# Patient Record
Sex: Female | Born: 1980 | Hispanic: No | Marital: Married | Smoking: Never smoker
Health system: Southern US, Community
[De-identification: ages and names within clinical notes are randomized; demographics above are authoritative.]

## PROBLEM LIST (undated history)

## (undated) DIAGNOSIS — O24419 Gestational diabetes mellitus in pregnancy, unspecified control: Secondary | ICD-10-CM

## (undated) HISTORY — DX: Gestational diabetes mellitus in pregnancy, unspecified control: O24.419

## (undated) HISTORY — PX: NO PAST SURGERIES: SHX2092

---

## 2014-11-08 ENCOUNTER — Emergency Department (HOSPITAL_COMMUNITY)
Admission: EM | Admit: 2014-11-08 | Discharge: 2014-11-08 | Disposition: A | Discharge status: Home / Self Care | Payer: Worker's Compensation | Attending: Emergency Medicine | Admitting: Emergency Medicine

## 2014-11-08 ENCOUNTER — Encounter (HOSPITAL_COMMUNITY): Payer: Self-pay | Admitting: Emergency Medicine

## 2014-11-08 DIAGNOSIS — S59912A Unspecified injury of left forearm, initial encounter: Secondary | ICD-10-CM | POA: Diagnosis present

## 2014-11-08 DIAGNOSIS — Y9259 Other trade areas as the place of occurrence of the external cause: Secondary | ICD-10-CM | POA: Insufficient documentation

## 2014-11-08 DIAGNOSIS — S51812A Laceration without foreign body of left forearm, initial encounter: Secondary | ICD-10-CM | POA: Diagnosis not present

## 2014-11-08 DIAGNOSIS — W25XXXA Contact with sharp glass, initial encounter: Secondary | ICD-10-CM | POA: Diagnosis not present

## 2014-11-08 DIAGNOSIS — Y990 Civilian activity done for income or pay: Secondary | ICD-10-CM | POA: Insufficient documentation

## 2014-11-08 DIAGNOSIS — Y9389 Activity, other specified: Secondary | ICD-10-CM | POA: Diagnosis not present

## 2014-11-08 MED ORDER — LIDOCAINE-EPINEPHRINE 2 %-1:100000 IJ SOLN
20.0000 mL | Freq: Once | INTRAMUSCULAR | Status: AC
  Administered 2014-11-08: 20 mL via INTRADERMAL
  Filled 2014-11-08: qty 1

## 2014-11-08 NOTE — Discharge Instructions (Signed)
Take the prescribed medication as directed. Keep sutures clean and dry.  Sutures should be removed in 1 week-- we can do this for you here or your primary care physician may do it. Return to the ED for new or worsening symptoms.

## 2014-11-08 NOTE — ED Notes (Addendum)
Per EMS- Pt was washing glassware at work and cut forearm .  6 cm open wound on l/anterior forearm. Pressure dressing applied. Continues to  slow bleed. Pt spoke with husband , then handed her phone to this RN. Husband stated that pt does not have any medical history. "Birth control via injection, denies medication allergies" Stated that he will be in in 30 minutes. PA at bedside, interview to be completed with translator Pt is alert and ambulatory

## 2014-11-08 NOTE — ED Notes (Signed)
Pt restated via translator- that: she received a tetanus shot 1 year ago and  arm was cut while washing a glass at work Pt was [employer] she will received stitches that must be removed in one week. Pt stated that she does not have a doctor

## 2014-11-08 NOTE — ED Notes (Signed)
Per EMS- pt was washing a glass and cut herself accidentally on left arm (approximately 2 inch laceration). Unsure if she has had a tetanus shot. Pt does not speak [place]. Unable to acquire any information from patient. VSS.

## 2014-11-08 NOTE — ED Notes (Signed)
Bed: WTR5 Expected date:  Expected time:  Means of arrival:  Comments: EMS-laceration

## 2014-11-08 NOTE — ED Provider Notes (Signed)
CSN: [id number]     Arrival date & time 11/08/14  1138 History   First MD Initiated Contact with Patient 11/08/14 1148     Chief Complaint  Patient presents with  . Extremity Laceration     (Consider location/radiation/quality/duration/timing/severity/associated sxs/prior Treatment) The history is provided by the patient and medical records.    This is a 34 year old female here with left forearm laceration that occurred while washing dishes. Patient works at a hotel.  Tetanus vaccination is up-to-date, given one less than a year ago.  She denies numbness or paresthesias of left arm. Bleeding minimal on arrival.  History reviewed. No pertinent past medical history. History reviewed. No pertinent past surgical history. No family history on file. Social History  Substance Use Topics  . Smoking status: None  . Smokeless tobacco: None  . Alcohol Use: None   OB History    No data available     Review of Systems  Skin: Positive for wound.  All other systems reviewed and are negative.     Allergies  Review of patient's allergies indicates no known allergies.  Home Medications   Prior to Admission medications   Not on File   BP 105/73 mmHg  Pulse 87  Temp(Src) 97.5 F (36.4 C) (Oral)  Resp 16  SpO2 100%   Physical Exam  Constitutional: She is oriented to person, place, and time. She appears well-developed and well-nourished. No distress.  HENT:  Head: Normocephalic and atraumatic.  Mouth/Throat: Oropharynx is clear and moist.  Eyes: Conjunctivae and EOM are normal. Pupils are equal, round, and reactive to light.  Neck: Normal range of motion. Neck supple.  Cardiovascular: Normal rate, regular rhythm and normal heart sounds.   Pulmonary/Chest: Effort normal and breath sounds normal. No respiratory distress. She has no wheezes.  Musculoskeletal: Normal range of motion.  6cm laceration to left volar forearm; minimal bleeding; no deep tissue, vessel, or tendon  involvement; no retained FB; full ROM of left arm; arm is NVI  Neurological: She is alert and oriented to person, place, and time.  Skin: Skin is warm and dry. She is not diaphoretic.  Psychiatric: She has a normal mood and affect.  Nursing note and vitals reviewed.   ED Course  Procedures (including critical care time)  LACERATION REPAIR Performed by: Garlon Hatchet Authorized by: Garlon Hatchet Consent: Verbal consent obtained. Risks and benefits: risks, benefits and alternatives were discussed Consent given by: patient Patient identity confirmed: provided demographic data Prepped and Draped in normal sterile fashion Wound explored  Laceration Location: left volar forearm  Laceration Length: 6cm  No Foreign Bodies seen or palpated  Anesthesia: local infiltration  Local anesthetic: lidocaine 2% with epinephrine  Anesthetic total: 10 ml  Irrigation method: syringe Amount of cleaning: standard  Skin closure: 4-0 prolene  Number of sutures: 10  Technique: simple interrupted  Patient tolerance: Patient tolerated the procedure well with no immediate complications.  Labs Review Labs Reviewed - No data to display  Imaging Review No results found. I have personally reviewed and evaluated these images and lab results as part of my medical decision-making.   EKG Interpretation None      MDM   Final diagnoses:  Forearm laceration, left, initial encounter   34 year old female with left forearm laceration that occurred prior to arrival while washing dishes.  6 cm laceration noted to left volar forearm. Tetanus up-to-date.  Laceration repaired as above, tolerated well.  Dressing applied.  Wound care and follow-up instructions were  discussed with patient and husband via language interpreter.  FU for suture removal in 1 week.  UDS for worker's compo was performed by phlebotomy prior to discharge.  Discussed plan with patient, he/she acknowledged understanding and agreed  with plan of care.  Return precautions given for new or worsening symptoms.  Garlon Hatchet, PA-C 11/08/14 1415  Garlon Hatchet, PA-C 11/08/14 1416  Lavera Guise, MD 11/08/14 [phone]

## 2014-11-15 ENCOUNTER — Encounter (HOSPITAL_COMMUNITY): Payer: Self-pay | Admitting: Emergency Medicine

## 2014-11-15 ENCOUNTER — Emergency Department (HOSPITAL_COMMUNITY)
Admission: EM | Admit: 2014-11-15 | Discharge: 2014-11-15 | Disposition: A | Discharge status: Home / Self Care | Payer: Worker's Compensation | Attending: Emergency Medicine | Admitting: Emergency Medicine

## 2014-11-15 DIAGNOSIS — Z4802 Encounter for removal of sutures: Secondary | ICD-10-CM | POA: Insufficient documentation

## 2014-11-15 DIAGNOSIS — Z4801 Encounter for change or removal of surgical wound dressing: Secondary | ICD-10-CM | POA: Diagnosis not present

## 2014-11-15 DIAGNOSIS — Z5189 Encounter for other specified aftercare: Secondary | ICD-10-CM

## 2014-11-15 MED ORDER — BACITRACIN ZINC 500 UNIT/GM EX OINT
TOPICAL_OINTMENT | CUTANEOUS | Status: AC
  Filled 2014-11-15: qty 0.9

## 2014-11-15 NOTE — Discharge Instructions (Signed)
Incision Care ° An incision is a surgical cut to open your skin. You need to take care of your incision. This helps you to not get an infection. °HOME CARE °· Only take medicine as told by your doctor. °· Do not take off your bandage (dressing) or get your incision wet until your doctor approves. Change the bandage and call your doctor if the bandage gets wet, dirty, or starts to smell. °· Take showers. Do not take baths, swim, or do anything that may soak your incision until it heals. °· Return to your normal diet and activities as told or allowed by your doctor. °· Avoid lifting any weight until your doctor approves. °· Put medicine that helps lessen itching on your incision as told by your doctor. Do not pick or scratch at your incision. °· Keep your doctor visit to have your stitches (sutures) or staples removed. °· Drink enough fluids to keep your pee (urine) clear or pale yellow. °GET HELP RIGHT AWAY IF: °· You have a fever. °· You have a rash. °· You are dizzy, or you pass out (faint) while standing. °· You have trouble breathing. °· You have a reaction or side effects to medicine given to you. °· You have redness, puffiness (swelling), or more pain in the incision and medicine does not help. °· You have fluid, blood, or yellowish-white fluid (pus) coming from the incision lasting over 1 day. °· You have muscle aches, chills, or you feel sick. °· You have a bad smell coming from the incision or bandage. °· Your incision opens up after stitches, staples, or sticky strips have been removed. °· You keep feeling sick to your stomach (nauseous) or keep throwing up (vomiting). °MAKE SURE YOU:  °· Understand these instructions. °· Will watch your condition. °· Will get help right away if you are not doing well or get worse. °Document Released: 05/27/2011 Document Reviewed: 04/28/2013 °ExitCare® Patient Information ©2015 [employer]. This information is not intended to replace advice given to you by your health  care provider. Make sure you discuss any questions you have with your health care provider. ° ° ° ° °

## 2014-11-15 NOTE — ED Provider Notes (Signed)
CSN: [id number]     Arrival date & time 11/15/14  1734 History   This chart was scribed for non-physician practitioner, Vickie Berry, PA-C, working with Vickie Loveless, MD by Vickie Prince, ED Scribe. This patient was seen in room WTR8/WTR8 and the patient's care was started at 6:07 PM.   Chief Complaint  Patient presents with  . Suture / Staple Removal    L FA   The history is provided by the patient and the spouse. No language interpreter was used.    HPI Comments:  Vickie Prince is a 34 y.o. female who presents to the Emergency Department for suture removal. Pt had 10 sutures placed in her left forearm on 11/08/14 for a laceration to the site which occurred while washing dishes.  She denies drainage from the site and pain. Pt has no new complaints or symptoms at this time. Pt's first language is not [place]; history translated/provided by spouse.   History reviewed. No pertinent past medical history. History reviewed. No pertinent past surgical history. No family history on file. Social History  Substance Use Topics  . Smoking status: Unknown If Ever Smoked  . Smokeless tobacco: None  . Alcohol Use: None   OB History    No data available     Review of Systems  Constitutional: Negative for fever and chills.  Skin: Positive for wound (Suture removal).   Allergies  Review of patient's allergies indicates no known allergies.  Home Medications   Prior to Admission medications   Not on File   BP 112/71 mmHg  Pulse 70  Resp 16  SpO2 100% Physical Exam  Constitutional: She is oriented to person, place, and time. She appears well-developed and well-nourished. No distress.  HENT:  Head: Normocephalic and atraumatic.  Right Ear: External ear normal.  Left Ear: External ear normal.  Nose: Nose normal.  Mouth/Throat: Oropharynx is clear and moist. No oropharyngeal exudate.  Eyes: Conjunctivae and EOM are normal. Pupils are equal, round, and reactive to light. Right eye exhibits  no discharge. Left eye exhibits no discharge. No scleral icterus.  Neck: Normal range of motion. Neck supple. No JVD present. No tracheal deviation present.  Cardiovascular: Normal rate and regular rhythm.   Pulmonary/Chest: Effort normal and breath sounds normal. No stridor. No respiratory distress.  Musculoskeletal: Normal range of motion. She exhibits no edema.  Lymphadenopathy:    She has no cervical adenopathy.  Neurological: She is alert and oriented to person, place, and time. She exhibits normal muscle tone. Coordination normal.  Skin: Skin is warm and dry. She is not diaphoretic.  10 sutures noted to left forearm--clean, dry, intact  Mild bruising noted to site    Psychiatric: She has a normal mood and affect. Her behavior is normal. Judgment and thought content normal.   ED Course  Procedures   DIAGNOSTIC STUDIES:  Oxygen Saturation is 100% on RA, normal by my interpretation.    COORDINATION OF CARE:  6:11 PM Discussed treatment plan with pt at bedside and pt agreed to plan. Sutures removed at bedside. Pt tolerated procedure well    Labs Review Labs Reviewed - No data to display  Imaging Review No results found. I have personally reviewed and evaluated these images and lab results as part of my medical decision-making.   EKG Interpretation None      MDM   Final diagnoses:  None    The laceration was healing nicely, was clean, dry, and intact. Sutures removed, pt tolerated, no bleeding.  Pt given wound care instructions, was asked to stay while we apply bandage, however pt and spouse had left the room prior to discharge by the nurse and therefore did not receive AVS.    I personally performed the services described in this documentation, which was scribed in my presence. The recorded information has been reviewed and is accurate.    Vickie Berry, PA-C 11/16/14 Vickie Prince  Vickie Loveless, MD 11/19/14 [phone]

## 2014-11-15 NOTE — ED Notes (Signed)
Pt awake, alert, appropriate, here for suture removal from LFA.  Denies complains.  Sutures c/d/i.  Skin PWD.  MAEI.  NAD.

## 2014-11-15 NOTE — ED Notes (Signed)
Dressing applied by EDPA, pt left prior to d/c instructions.

## 2016-02-12 LAB — OB RESULTS CONSOLE GC/CHLAMYDIA
CHLAMYDIA, DNA PROBE: NEGATIVE
GC PROBE AMP, GENITAL: NEGATIVE

## 2016-02-12 LAB — OB RESULTS CONSOLE VARICELLA ZOSTER ANTIBODY, IGG: Varicella: IMMUNE

## 2016-02-12 LAB — OB RESULTS CONSOLE ABO/RH: RH Type: NEGATIVE

## 2016-02-12 LAB — OB RESULTS CONSOLE HIV ANTIBODY (ROUTINE TESTING): HIV: NONREACTIVE

## 2016-02-12 LAB — OB RESULTS CONSOLE RPR: RPR: NONREACTIVE

## 2016-02-12 LAB — OB RESULTS CONSOLE ANTIBODY SCREEN: Antibody Screen: NEGATIVE

## 2016-02-12 LAB — SICKLE CELL SCREEN: SICKLE CELL SCREEN: NEGATIVE

## 2016-02-12 LAB — OB RESULTS CONSOLE RUBELLA ANTIBODY, IGM: Rubella: IMMUNE

## 2016-02-12 LAB — OB RESULTS CONSOLE HEPATITIS B SURFACE ANTIGEN: Hepatitis B Surface Ag: NEGATIVE

## 2016-03-18 DIAGNOSIS — O24419 Gestational diabetes mellitus in pregnancy, unspecified control: Secondary | ICD-10-CM

## 2016-03-18 HISTORY — DX: Gestational diabetes mellitus in pregnancy, unspecified control: O24.419

## 2016-03-18 NOTE — L&D Delivery Note (Addendum)
Delivery Note Called for emergent delivery.  Patient was 6cm about 20 min ago and got a dose of Fentanyl.  Now complete and crowning.    At 9:09 PM a viable and healthy female was delivered via Vaginal, Spontaneous Delivery (Presentation: OA  ).  APGAR: 8, 9; weight  .  FHR slow (80s) at first, sent to warmer, team arrived and baby improved rapidly. Placenta status: spont and grossly intact with 3V Cord:  with the following complications: nuchal x 1  Cord pH: Pending  Anesthesia:  Local with repair Episiotomy: None Lacerations: Periurethral Suture Repair: 4-0 Monocryl Est. Blood Loss (mL): 200  Mom to postpartum.  Baby to Couplet care / Skin to Skin.  Vickie BourgeoisMarie Vasilisa Prince 08/30/2016, 9:31 PM

## 2016-03-22 ENCOUNTER — Other Ambulatory Visit (HOSPITAL_COMMUNITY): Payer: Self-pay | Admitting: Obstetrics and Gynecology

## 2016-03-22 DIAGNOSIS — O280 Abnormal hematological finding on antenatal screening of mother: Secondary | ICD-10-CM

## 2016-03-22 DIAGNOSIS — Z3A18 18 weeks gestation of pregnancy: Secondary | ICD-10-CM

## 2016-03-25 ENCOUNTER — Encounter (HOSPITAL_COMMUNITY): Payer: Self-pay | Admitting: Obstetrics and Gynecology

## 2016-04-03 ENCOUNTER — Encounter (HOSPITAL_COMMUNITY): Payer: Self-pay | Admitting: *Deleted

## 2016-04-04 ENCOUNTER — Ambulatory Visit (HOSPITAL_COMMUNITY)
Admission: RE | Admit: 2016-04-04 | Discharge: 2016-04-04 | Disposition: A | Discharge status: Home / Self Care | Payer: Medicaid Other | Source: Ambulatory Visit | Attending: Obstetrics and Gynecology | Admitting: Obstetrics and Gynecology

## 2016-04-04 ENCOUNTER — Encounter (HOSPITAL_COMMUNITY): Payer: Self-pay

## 2016-04-04 DIAGNOSIS — O280 Abnormal hematological finding on antenatal screening of mother: Secondary | ICD-10-CM

## 2016-04-04 DIAGNOSIS — Z363 Encounter for antenatal screening for malformations: Secondary | ICD-10-CM | POA: Insufficient documentation

## 2016-04-04 DIAGNOSIS — Z3A18 18 weeks gestation of pregnancy: Secondary | ICD-10-CM | POA: Insufficient documentation

## 2016-04-04 DIAGNOSIS — O289 Unspecified abnormal findings on antenatal screening of mother: Secondary | ICD-10-CM | POA: Diagnosis not present

## 2016-04-08 DIAGNOSIS — O280 Abnormal hematological finding on antenatal screening of mother: Secondary | ICD-10-CM | POA: Insufficient documentation

## 2016-04-08 NOTE — Progress Notes (Signed)
Genetic Counseling  High-Risk Gestation Note  Appointment Date:  04/04/16 Referred By: Ferman HammingMcNamara, Michael T, MD Date of Birth:  04/22/1980   Pregnancy History: G2P1001 Estimated Date of Delivery: 09/03/16 Estimated Gestational Age: 6335w2d Attending: Particia NearingMartha Decker, MD   [name] and her partner, [name], were seen for genetic counseling because of an increased risk for fetal Down syndrome based on Quad screening [facility]. Vickie Prince/English medical interpreter [phone] from [facility] [employer] provided telephonic interpretation for today's visit.   In summary:  Reviewed results of Quad screening test  Increased risk for fetal Down syndrome (1 in 5682)  Discussed additional screening options  NIPS - declined  Ultrasound - performed today; within normal limits  Discussed diagnostic testing options  Amniocentesis - declined  Reviewed family history concerns   They were counseled regarding the screening result and the associated 1 in 82 risk for fetal Down syndrome.  We reviewed chromosomes, nondisjunction, and the common features and variable prognosis of Down syndrome.  In addition, we reviewed the screen adjusted reduction in risks for trisomy 18 and open neural tube defects.  We also discussed other explanations for a screen positive result including: a gestational dating error, differences in maternal metabolism, and normal variation.  We reviewed other available screening options including noninvasive prenatal screening (NIPS)/cell free DNA (cfDNA) screening and detailed ultrasound. They were counseled that screening tests are used to modify a patient's a priori risk for aneuploidy, typically based on age. This estimate provides a pregnancy specific risk assessment. We reviewed the benefits and limitations of each option. Specifically, we discussed the conditions for which each test screens, the detection rates, and false positive  rates of each. They were also counseled regarding diagnostic testing via amniocentesis. We reviewed the approximate 1 in 300-500 risk for complications from amniocentesis, including spontaneous pregnancy loss. We discussed the possible results that the tests might provide including: positive, negative, unanticipated, and no result. Finally, they were counseled regarding the cost of each option and potential out of pocket expenses.   After consideration of all the options, they declined NIPS and amniocentesis, stating that they would rather await postnatal evaluation for Down syndrome, if it is warranted at that time. The couple indicated that they would not want to know prenatally regarding fetal Down syndrome.    A complete ultrasound was performed today. The ultrasound report will be sent under separate cover. There were no visualized fetal anomalies or markers suggestive of aneuploidy. They understand that screening tests cannot rule out all birth defects or genetic syndromes. The patient was advised of this limitation and states she still does not want additional testing at this time.   Both family histories were reviewed and found to be noncontributory for birth defects, intellectual disability, recurrent pregnancy loss, or known genetic conditions. Without further information regarding the provided family history, an accurate genetic risk cannot be calculated. Further genetic counseling is warranted if more information is obtained.  [name] denied exposure to environmental toxins or chemical agents. She denied the use of alcohol, tobacco or street drugs.   I counseled this couple for approximately 45 minutes regarding the above risks and available options.   Vickie PlowmanKaren Shaheim Mahar, MS,  Certified Genetic Counselor 04/08/2016

## 2016-04-23 ENCOUNTER — Other Ambulatory Visit (HOSPITAL_COMMUNITY): Payer: Self-pay

## 2016-07-25 ENCOUNTER — Encounter: Payer: Self-pay | Admitting: Family Medicine

## 2016-07-25 ENCOUNTER — Ambulatory Visit (INDEPENDENT_AMBULATORY_CARE_PROVIDER_SITE_OTHER): Payer: Medicaid Other | Admitting: Family Medicine

## 2016-07-25 VITALS — BP 105/64 | HR 78 | Wt 145.0 lb

## 2016-07-25 DIAGNOSIS — O09899 Supervision of other high risk pregnancies, unspecified trimester: Secondary | ICD-10-CM

## 2016-07-25 DIAGNOSIS — O24419 Gestational diabetes mellitus in pregnancy, unspecified control: Secondary | ICD-10-CM

## 2016-07-25 DIAGNOSIS — Z789 Other specified health status: Secondary | ICD-10-CM | POA: Diagnosis not present

## 2016-07-25 DIAGNOSIS — O09893 Supervision of other high risk pregnancies, third trimester: Secondary | ICD-10-CM

## 2016-07-25 DIAGNOSIS — O280 Abnormal hematological finding on antenatal screening of mother: Secondary | ICD-10-CM | POA: Diagnosis not present

## 2016-07-25 MED ORDER — ACCU-CHEK NANO SMARTVIEW W/DEVICE KIT: 1.0000 | PACK | 0 refills | Status: DC

## 2016-07-25 MED ORDER — GLUCOSE BLOOD VI STRP: ORAL_STRIP | 12 refills | Status: DC

## 2016-07-25 MED ORDER — ACCU-CHEK FASTCLIX LANCETS MISC: 1.0000 [IU] | Freq: Four times a day (QID) | 12 refills | Status: DC

## 2016-07-25 NOTE — Progress Notes (Signed)
INtrepreter [phone]

## 2016-07-25 NOTE — Progress Notes (Signed)
[phone]  [place]/ English interpreter

## 2016-07-25 NOTE — Progress Notes (Signed)
  Subjective:    Vickie Prince is a G2P1001 6766w2d being seen today for her first obstetrical visit.  Her obstetrical history is significant for GDM and increased DSR. [facility]. Refered to our office due to failing 3hr GTT. Has elevated DSR - declined NIPS, amnio.. Patient does intend to breast feed. Pregnancy history fully reviewed.  Patient reports no complaints.  Vitals:   07/25/16 0812  BP: 105/64  Pulse: 78  Weight: 145 lb (65.8 kg)    HISTORY: OB History  Gravida Para Term Preterm AB Living  2 1 1     1   SAB TAB Ectopic Multiple Live Births          1    # Outcome Date GA Lbr Len/2nd Weight Sex Delivery Anes PTL Lv  2 Current           1 Term    6 lb 9.8 oz (3 kg) M Vag-Spont   LIV     Past Medical History:  Diagnosis Date  . Medical history non-contributory    Past Surgical History:  Procedure Laterality Date  . NO PAST SURGERIES     No family history on file.   Exam    Uterus:     Pelvic Exam:   System:     Skin: normal coloration and turgor, no rashes    Neurologic: gait normal; reflexes normal and symmetric, no focal deficits   Extremities: normal strength, tone, and muscle mass   HEENT PERRLA and extra ocular movement intact   Mouth/Teeth mucous membranes moist, pharynx normal without lesions   Neck supple and no masses   Cardiovascular: regular rate and rhythm, no murmurs or gallops   Respiratory:  appears well, vitals normal, no respiratory distress, acyanotic, normal RR, ear and throat exam is normal, neck free of mass or lymphadenopathy, chest clear, no wheezing, crepitations, rhonchi, normal symmetric air entry   Abdomen: soft, non-tender; bowel sounds normal; no masses,  no organomegaly   Fundus:  32 weeks      Assessment:    Pregnancy: G2P1001 Patient Active Problem List   Diagnosis Date Noted  . Supervision of other high risk pregnancy, antepartum 07/25/2016  . Non-English speaking patient 07/25/2016   . Gestational diabetes mellitus (GDM) in third trimester 07/25/2016  . Abnormal maternal serum screening test 04/08/2016        Plan:     1. Supervision of other high risk pregnancy, antepartum FHT normal. Reviewed PNL - appears normal, other than Quad. - Referral to Nutrition and Diabetes Services  2. Non-English speaking patient Interpreter Used. - Referral to Nutrition and Diabetes Services  3. Gestational diabetes mellitus (GDM) in third trimester, gestational diabetes method of control unspecified Discussed testing CBGs. Will refer to Diabetes and have pt return. - Referral to Nutrition and Diabetes Services  4. Abnormal maternal serum screening test Declines NIPS.   Levie HeritageJacob J Hanley Woerner 07/25/2016

## 2016-08-06 ENCOUNTER — Encounter: Payer: Self-pay | Admitting: Dietician

## 2016-08-06 ENCOUNTER — Encounter: Payer: Medicaid Other | Attending: Family Medicine | Admitting: Dietician

## 2016-08-06 DIAGNOSIS — Z3A35 35 weeks gestation of pregnancy: Secondary | ICD-10-CM | POA: Insufficient documentation

## 2016-08-06 DIAGNOSIS — O09893 Supervision of other high risk pregnancies, third trimester: Secondary | ICD-10-CM | POA: Insufficient documentation

## 2016-08-06 DIAGNOSIS — O24419 Gestational diabetes mellitus in pregnancy, unspecified control: Secondary | ICD-10-CM | POA: Diagnosis not present

## 2016-08-06 DIAGNOSIS — Z789 Other specified health status: Secondary | ICD-10-CM | POA: Insufficient documentation

## 2016-08-06 DIAGNOSIS — Z713 Dietary counseling and surveillance: Secondary | ICD-10-CM | POA: Insufficient documentation

## 2016-08-06 NOTE — Progress Notes (Signed)
Diabetes Self-Management Education  Visit Type: First/Initial  Appt. Start Time: 0835 Appt. End Time: 1000  08/06/2016  [name], identified by name and date of birth, is a 36 y.o. female with a diagnosis of Diabetes: Gestational Diabetes.   She is [redacted] weeks gestation.  Her weight today was 147 increased from 145 lbs 2 weeks ago.  She does not know her prepregnancy weight.  Patient lives with her husband, 1 yo step child and 1 yo child.  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  She has been in the [place] 3 years from [place] and speaks [place].  She is here today with her interpretor Kibrom Gebremeriam from [employer].  She follows a low fat diet with meat only once per week and mostly vegetables.  ASSESSMENT  Weight 147 lb (66.7 kg), last menstrual period 11/28/2015. There is no height or weight on file to calculate BMI.      Diabetes Self-Management Education - 08/06/16 0840      Visit Information   Visit Type First/Initial     Initial Visit   Diabetes Type Gestational Diabetes   Are you currently following a meal plan? No   Are you taking your medications as prescribed? Not on Medications   Date Diagnosed 5/18     Health Coping   How would you rate your overall health? Good     Psychosocial Assessment   Patient Belief/Attitude about Diabetes Motivated to manage diabetes   Self-care barriers English as a second language   Self-management support Doctor's office;Family  Interpretor   Other persons present Patient;Other (comment)  Interpretor   Patient Concerns Nutrition/Meal planning;Other (comment)  GDM   Special Needs Other (comment);Verbal instruction  with interpretor   Preferred Learning Style Auditory   Learning Readiness Ready   How often do you need to have someone help you when you read instructions, pamphlets, or other written materials from your doctor or pharmacy? 5 - Always   What is the last grade level you completed in school? 5th  grade in [place]     Pre-Education Assessment   Patient understands the diabetes disease and treatment process. Needs Instruction   Patient understands incorporating nutritional management into lifestyle. Needs Instruction   Patient undertands incorporating physical activity into lifestyle. Needs Instruction   Patient understands using medications safely. Needs Instruction   Patient understands monitoring blood glucose, interpreting and using results Needs Instruction   Patient understands prevention, detection, and treatment of acute complications. Needs Instruction   Patient understands prevention, detection, and treatment of chronic complications. Needs Instruction   Patient understands how to develop strategies to address psychosocial issues. Needs Instruction   Patient understands how to develop strategies to promote health/change behavior. Needs Instruction     Complications   How often do you check your blood sugar? 0 times/day (not testing)     Dietary Intake   Breakfast Cereal with milk OR WW bread and juice (16 ounces)  5:30   Snack (morning) fruit   Lunch bread, vegetables, beans  12:00   Snack (afternoon) coffee with 1 %milk, 1 tsp sugar with bread   Dinner simular to lunch, meat occasionally (once per week)  7   Snack (evening) none   Beverage(s) coffee with 1% milk, 1 tsp sugar; water     Exercise   Exercise Type Light (walking / raking leaves)  standing a lot at work and cooking and other chairs at work     Patient Education  Previous Diabetes Education No   Disease state  Other (comment)   Nutrition management  Food label reading, portion sizes and measuring food.;Role of diet in the treatment of diabetes and the relationship between the three main macronutrients and blood glucose level;Meal options for control of blood glucose level and chronic complications.   Physical activity and exercise  Role of exercise on diabetes management, blood pressure control and  cardiac health.;Helped patient identify appropriate exercises in relation to his/her diabetes, diabetes complications and other health issue.   Monitoring Purpose and frequency of SMBG.;Identified appropriate SMBG and/or A1C goals.;Taught/evaluated SMBG meter.   Acute complications Taught treatment of hypoglycemia - the 15 rule.   Preconception care Pregnancy and GDM  Role of pre-pregnancy blood glucose control on the development of the fetus;Reviewed with patient blood glucose goals with pregnancy   Personal strategies to promote health Helped patient develop diabetes management plan for (enter comment)     Individualized Goals (developed by patient)   Nutrition General guidelines for healthy choices and portions discussed   Physical Activity Exercise 3-5 times per week   Medications Not Applicable   Monitoring  test my blood glucose as discussed   Problem Solving timing for blood sugars and how to do this at work, time for snacks, protein with snacks   Health Coping discuss diabetes with (comment)  MD/RD     Post-Education Assessment   Patient understands the diabetes disease and treatment process. Demonstrates understanding / competency   Patient understands incorporating nutritional management into lifestyle. Demonstrates understanding / competency   Patient undertands incorporating physical activity into lifestyle. Demonstrates understanding / competency   Patient understands using medications safely. Demonstrates understanding / competency   Patient understands monitoring blood glucose, interpreting and using results Demonstrates understanding / competency   Patient understands prevention, detection, and treatment of acute complications. Demonstrates understanding / competency   Patient understands prevention, detection, and treatment of chronic complications. Demonstrates understanding / competency   Patient understands how to develop strategies to address psychosocial issues.  Demonstrates understanding / competency   Patient understands how to develop strategies to promote health/change behavior. Demonstrates understanding / competency     Outcomes   Expected Outcomes Demonstrated interest in learning. Expect positive outcomes   Future DMSE PRN   Program Status Completed      Individualized Plan for Diabetes Self-Management Training:   Learning Objective:  Patient will have a greater understanding of diabetes self-management. Patient education plan is to attend individual and/or group sessions per assessed needs and concerns.   Plan:   Avoid added sugars. Avoid fruit in the morning. Add a small amount of protein with each meal and snack. (beans, yogurt, egg, small amount of meat).  Expected Outcomes:  Demonstrated interest in learning. Expect positive outcomes  Education material provided: Nutrition During Pregnancy for gestational diabetes.  We have no information in her language but highlighted the blood sugar goals on the paper.  If problems or questions, patient to contact team via:  Phone  Future DSME appointment: PRN

## 2016-08-08 ENCOUNTER — Other Ambulatory Visit (HOSPITAL_COMMUNITY)
Admission: RE | Admit: 2016-08-08 | Discharge: 2016-08-08 | Disposition: A | Discharge status: Home / Self Care | Payer: Medicaid Other | Source: Ambulatory Visit | Attending: Family Medicine | Admitting: Family Medicine

## 2016-08-08 ENCOUNTER — Ambulatory Visit (INDEPENDENT_AMBULATORY_CARE_PROVIDER_SITE_OTHER): Payer: Medicaid Other | Admitting: Family Medicine

## 2016-08-08 VITALS — BP 105/68 | HR 97 | Wt 149.0 lb

## 2016-08-08 DIAGNOSIS — Z3A36 36 weeks gestation of pregnancy: Secondary | ICD-10-CM

## 2016-08-08 DIAGNOSIS — Z789 Other specified health status: Secondary | ICD-10-CM

## 2016-08-08 DIAGNOSIS — O09893 Supervision of other high risk pregnancies, third trimester: Secondary | ICD-10-CM | POA: Insufficient documentation

## 2016-08-08 DIAGNOSIS — O24419 Gestational diabetes mellitus in pregnancy, unspecified control: Secondary | ICD-10-CM | POA: Diagnosis not present

## 2016-08-08 DIAGNOSIS — O09899 Supervision of other high risk pregnancies, unspecified trimester: Secondary | ICD-10-CM

## 2016-08-08 NOTE — Progress Notes (Signed)
Subjective:  Vickie Prince is a 36 y.o. G2P1001 at 3743w2d being seen today for ongoing prenatal care.  She is currently monitored for the following issues for this high-risk pregnancy and has Abnormal maternal serum screening test; Supervision of other high risk pregnancy, antepartum; Non-English speaking patient; and Gestational diabetes mellitus (GDM) in third trimester on her problem list.  GDM: Patient diet controlled.  Reports no hypoglycemic episodes.  Tolerating medication well Started testing 2 days ago. Fasting: controlled 2hr PP: 2 elevated values, otherwise controlled.  Patient reports no complaints.  Contractions: Not present. Vag. Bleeding: None.  Movement: Present. Denies leaking of fluid.   The following portions of the patient's history were reviewed and updated as appropriate: allergies, current medications, past family history, past medical history, past social history, past surgical history and problem list. Problem list updated.  Objective:   Vitals:   08/08/16 1116  BP: 105/68  Pulse: 97  Weight: 149 lb (67.6 kg)    Fetal Status:     Movement: Present     General:  Alert, oriented and cooperative. Patient is in no acute distress.  Skin: Skin is warm and dry. No rash noted.   Cardiovascular: Normal heart rate noted  Respiratory: Normal respiratory effort, no problems with respiration noted  Abdomen: Soft, gravid, appropriate for gestational age. Pain/Pressure: Present     Pelvic: Vag. Bleeding: None Vag D/C Character: Thin   Cervical exam deferred        Extremities: Normal range of motion.  Edema: None  Mental Status: Normal mood and affect. Normal behavior. Normal judgment and thought content.   Urinalysis:      Assessment and Plan:  Pregnancy: G2P1001 at 7743w2d  1. Supervision of other high risk pregnancy, antepartum FHT and FH normal.  2. Gestational diabetes mellitus (GDM) in third trimester, gestational diabetes method of control unspecified Continue  checking CBGs fasting and 2hr PP. Delivery at 40 weeks  3. [redacted] weeks gestation of pregnancy - GC/Chlamydia probe amp ([place] - Culture, beta strep (group b only)  4. Non-English speaking patient Interpreter used.   Preterm labor symptoms and general obstetric precautions including but not limited to vaginal bleeding, contractions, leaking of fluid and fetal movement were reviewed in detail with the patient. Please refer to After Visit Summary for other counseling recommendations.  No Follow-up on file.   Levie HeritageStinson, Jacob J, DO

## 2016-08-09 LAB — OB RESULTS CONSOLE GBS: GBS: NEGATIVE

## 2016-08-12 LAB — CULTURE, BETA STREP (GROUP B ONLY): STREP GP B CULTURE: NEGATIVE

## 2016-08-13 LAB — GC/CHLAMYDIA PROBE AMP (~~LOC~~) NOT AT ARMC
Chlamydia: NEGATIVE
Neisseria Gonorrhea: NEGATIVE

## 2016-08-15 ENCOUNTER — Ambulatory Visit (INDEPENDENT_AMBULATORY_CARE_PROVIDER_SITE_OTHER): Payer: Medicaid Other | Admitting: Family Medicine

## 2016-08-15 VITALS — BP 117/74 | HR 84 | Ht 71.0 in | Wt 151.0 lb

## 2016-08-15 DIAGNOSIS — O24419 Gestational diabetes mellitus in pregnancy, unspecified control: Secondary | ICD-10-CM | POA: Diagnosis not present

## 2016-08-15 DIAGNOSIS — Z789 Other specified health status: Secondary | ICD-10-CM

## 2016-08-15 DIAGNOSIS — O09893 Supervision of other high risk pregnancies, third trimester: Secondary | ICD-10-CM | POA: Diagnosis not present

## 2016-08-15 DIAGNOSIS — O280 Abnormal hematological finding on antenatal screening of mother: Secondary | ICD-10-CM | POA: Diagnosis not present

## 2016-08-15 DIAGNOSIS — O09899 Supervision of other high risk pregnancies, unspecified trimester: Secondary | ICD-10-CM

## 2016-08-15 MED ORDER — GLUCOSE BLOOD VI STRP: ORAL_STRIP | 12 refills | Status: DC

## 2016-08-15 MED ORDER — METFORMIN HCL 500 MG PO TABS: 500.0000 mg | ORAL_TABLET | Freq: Every day | ORAL | 5 refills | Status: DC

## 2016-08-15 NOTE — Progress Notes (Signed)
Subjective:  Vickie Prince is a 36 y.o. G2P1001 at 5916w2d being seen today for ongoing prenatal care.  She is currently monitored for the following issues for this high-risk pregnancy and has Abnormal maternal serum screening test; Supervision of other high risk pregnancy, antepartum; Non-English speaking patient; and Gestational diabetes mellitus (GDM) in third trimester on her problem list.  GDM: Patient diet controlled.  Reports no hypoglycemic episodes.  Tolerating medication well Fasting: controlled 2hr PP: 91-182 (10 elevated)  Patient reports no complaints.  Contractions: Not present. Vag. Bleeding: None.  Movement: Present. Denies leaking of fluid.   The following portions of the patient's history were reviewed and updated as appropriate: allergies, current medications, past family history, past medical history, past social history, past surgical history and problem list. Problem list updated.  Objective:   Vitals:   08/15/16 1046 08/15/16 1048  BP: 117/74   Pulse: 84   Weight: 151 lb (68.5 kg)   Height:  5\' 11"  (1.803 m)    Fetal Status:     Movement: Present     General:  Alert, oriented and cooperative. Patient is in no acute distress.  Skin: Skin is warm and dry. No rash noted.   Cardiovascular: Normal heart rate noted  Respiratory: Normal respiratory effort, no problems with respiration noted  Abdomen: Soft, gravid, appropriate for gestational age. Pain/Pressure: Present     Pelvic: Vag. Bleeding: None     Cervical exam deferred        Extremities: Normal range of motion.  Edema: None  Mental Status: Normal mood and affect. Normal behavior. Normal judgment and thought content.   Urinalysis:      Assessment and Plan:  Pregnancy: G2P1001 at 6016w2d  1. Supervision of other high risk pregnancy, antepartum FHT and FH normal  2. Gestational diabetes mellitus (GDM) in third trimester, gestational diabetes method of control unspecified Start metformin 500mg  daily - no  clear pattern of when elevated, so well use metformin for sensitization NST today and twice weekly US next week for growth. Discussed induction at 39 weeks - pt wants to discuss with husband. I discussed risks of waiting past 39 weeks (increased risk of stillbirth).  3. Abnormal maternal serum screening test Declined further testing.   4. Non-English speaking patient Interpreter used.  Preterm labor symptoms and general obstetric precautions including but not limited to vaginal bleeding, contractions, leaking of fluid and fetal movement were reviewed in detail with the patient. Please refer to After Visit Summary for other counseling recommendations.  No Follow-up on file.   Levie HeritageStinson, Shloima Clinch J, DO

## 2016-08-15 NOTE — Progress Notes (Signed)
#  [id number] interpreter. Armandina StammerJennifer Jaleesa Cervi RN BSN

## 2016-08-19 ENCOUNTER — Ambulatory Visit (INDEPENDENT_AMBULATORY_CARE_PROVIDER_SITE_OTHER): Payer: Medicaid Other

## 2016-08-19 VITALS — BP 111/69 | HR 72

## 2016-08-19 DIAGNOSIS — O24415 Gestational diabetes mellitus in pregnancy, controlled by oral hypoglycemic drugs: Secondary | ICD-10-CM

## 2016-08-19 MED ORDER — GLUCOSE BLOOD VI STRP: ORAL_STRIP | 12 refills | Status: DC

## 2016-08-19 NOTE — Progress Notes (Signed)
Patient presents for NST only. Armandina StammerJennifer Howard RN BSN

## 2016-08-22 ENCOUNTER — Ambulatory Visit (INDEPENDENT_AMBULATORY_CARE_PROVIDER_SITE_OTHER): Payer: Medicaid Other | Admitting: Family Medicine

## 2016-08-22 ENCOUNTER — Encounter (HOSPITAL_COMMUNITY): Payer: Self-pay

## 2016-08-22 ENCOUNTER — Ambulatory Visit (HOSPITAL_COMMUNITY)
Admission: RE | Admit: 2016-08-22 | Discharge: 2016-08-22 | Disposition: A | Discharge status: Home / Self Care | Payer: Medicaid Other | Source: Ambulatory Visit | Attending: Family Medicine | Admitting: Family Medicine

## 2016-08-22 ENCOUNTER — Other Ambulatory Visit (HOSPITAL_COMMUNITY): Payer: Self-pay | Admitting: *Deleted

## 2016-08-22 ENCOUNTER — Other Ambulatory Visit: Payer: Self-pay | Admitting: Family Medicine

## 2016-08-22 VITALS — BP 108/62 | HR 80 | Wt 151.0 lb

## 2016-08-22 DIAGNOSIS — Z3A38 38 weeks gestation of pregnancy: Secondary | ICD-10-CM

## 2016-08-22 DIAGNOSIS — O24415 Gestational diabetes mellitus in pregnancy, controlled by oral hypoglycemic drugs: Secondary | ICD-10-CM

## 2016-08-22 DIAGNOSIS — O288 Other abnormal findings on antenatal screening of mother: Secondary | ICD-10-CM

## 2016-08-22 DIAGNOSIS — O09893 Supervision of other high risk pregnancies, third trimester: Secondary | ICD-10-CM | POA: Diagnosis not present

## 2016-08-22 DIAGNOSIS — Z9119 Patient's noncompliance with other medical treatment and regimen: Secondary | ICD-10-CM

## 2016-08-22 DIAGNOSIS — O403XX0 Polyhydramnios, third trimester, not applicable or unspecified: Secondary | ICD-10-CM | POA: Diagnosis not present

## 2016-08-22 DIAGNOSIS — O24419 Gestational diabetes mellitus in pregnancy, unspecified control: Secondary | ICD-10-CM

## 2016-08-22 DIAGNOSIS — Z789 Other specified health status: Secondary | ICD-10-CM | POA: Diagnosis not present

## 2016-08-22 DIAGNOSIS — O280 Abnormal hematological finding on antenatal screening of mother: Secondary | ICD-10-CM

## 2016-08-22 DIAGNOSIS — O09899 Supervision of other high risk pregnancies, unspecified trimester: Secondary | ICD-10-CM

## 2016-08-22 DIAGNOSIS — Z91199 Patient's noncompliance with other medical treatment and regimen due to unspecified reason: Secondary | ICD-10-CM

## 2016-08-22 DIAGNOSIS — O281 Abnormal biochemical finding on antenatal screening of mother: Secondary | ICD-10-CM

## 2016-08-22 NOTE — Progress Notes (Signed)
Interpreter #[id number] telephne used  Lannette DonathJennifer HowardRN

## 2016-08-22 NOTE — Addendum Note (Signed)
Addended by: Armandina StammerHOWARD, Verner Kopischke on: 08/22/2016 12:12 PM   Modules accepted: Orders

## 2016-08-22 NOTE — Progress Notes (Signed)
Subjective:  Vickie Prince is a 36 y.o. G2P1001 at 4639w2d being seen today for ongoing prenatal care.  She is currently monitored for the following issues for this high-risk pregnancy and has Abnormal maternal serum screening test; Supervision of other high risk pregnancy, antepartum; Non-English speaking patient; and Gestational diabetes mellitus (GDM) in third trimester on her problem list.  GDM: Patient taking metformin 500mg  with breakfast.  Reports no hypoglycemic episodes.  Tolerating medication well Fasting: 70s-80s. 2hr PP: 80-123 (3 elevated)  Patient reports no complaints.  Contractions: Irregular. Vag. Bleeding: None.  Movement: Present. Denies leaking of fluid.   The following portions of the patient's history were reviewed and updated as appropriate: allergies, current medications, past family history, past medical history, past social history, past surgical history and problem list. Problem list updated.  Objective:   Vitals:   08/22/16 1110  BP: 108/62  Pulse: 80  Weight: 151 lb (68.5 kg)    Fetal Status: Fetal Heart Rate (bpm): NST   Movement: Present     General:  Alert, oriented and cooperative. Patient is in no acute distress.  Skin: Skin is warm and dry. No rash noted.   Cardiovascular: Normal heart rate noted  Respiratory: Normal respiratory effort, no problems with respiration noted  Abdomen: Soft, gravid, appropriate for gestational age. Pain/Pressure: Present     Pelvic: Vag. Bleeding: None     Cervical exam deferred        Extremities: Normal range of motion.  Edema: None  Mental Status: Normal mood and affect. Normal behavior. Normal judgment and thought content.   Urinalysis: Urine Protein: Negative Urine Glucose: Negative  Assessment and Plan:  Pregnancy: G2P1001 at 4539w2d  1. Supervision of other high risk pregnancy, antepartum FHT and FH normal  2. Non-English speaking patient Interpreter used  3. Gestational diabetes mellitus (GDM) in third  trimester controlled on oral hypoglycemic drug Continue metformin Continue NST twice weekly US today. I again discussed induction at 39 weeks - pt declined. She would like the baby to come on its own, "when God wants it to". Reviewed risks of waiting, significantly fetal death. Pt understands risks. Will continue to offer induction at each appointment.  4. Abnormal maternal serum screening test Declined NIPs  5. Pregnancy complicated by noncompliance, antepartum, third trimester Will continue to offer induction to patient.  Term labor symptoms and general obstetric precautions including but not limited to vaginal bleeding, contractions, leaking of fluid and fetal movement were reviewed in detail with the patient. Please refer to After Visit Summary for other counseling recommendations.  No Follow-up on file.   Levie HeritageStinson, Kyiah Canepa J, DO

## 2016-08-23 ENCOUNTER — Encounter: Payer: Self-pay | Admitting: Family Medicine

## 2016-08-23 DIAGNOSIS — O403XX0 Polyhydramnios, third trimester, not applicable or unspecified: Secondary | ICD-10-CM | POA: Insufficient documentation

## 2016-08-26 ENCOUNTER — Ambulatory Visit (INDEPENDENT_AMBULATORY_CARE_PROVIDER_SITE_OTHER): Payer: Medicaid Other | Admitting: *Deleted

## 2016-08-26 VITALS — BP 113/70 | HR 82

## 2016-08-26 DIAGNOSIS — O24415 Gestational diabetes mellitus in pregnancy, controlled by oral hypoglycemic drugs: Secondary | ICD-10-CM

## 2016-08-29 ENCOUNTER — Ambulatory Visit (INDEPENDENT_AMBULATORY_CARE_PROVIDER_SITE_OTHER): Payer: Medicaid Other | Admitting: Obstetrics & Gynecology

## 2016-08-29 ENCOUNTER — Ambulatory Visit (HOSPITAL_COMMUNITY)
Admission: RE | Admit: 2016-08-29 | Discharge: 2016-08-29 | Disposition: A | Discharge status: Home / Self Care | Payer: Medicaid Other | Source: Ambulatory Visit | Attending: Family Medicine | Admitting: Family Medicine

## 2016-08-29 ENCOUNTER — Encounter (HOSPITAL_COMMUNITY): Payer: Self-pay

## 2016-08-29 ENCOUNTER — Other Ambulatory Visit: Payer: Self-pay | Admitting: Obstetrics and Gynecology

## 2016-08-29 ENCOUNTER — Other Ambulatory Visit: Payer: Self-pay | Admitting: Obstetrics & Gynecology

## 2016-08-29 VITALS — Wt 155.0 lb

## 2016-08-29 DIAGNOSIS — O09893 Supervision of other high risk pregnancies, third trimester: Secondary | ICD-10-CM | POA: Diagnosis not present

## 2016-08-29 DIAGNOSIS — Z9119 Patient's noncompliance with other medical treatment and regimen: Secondary | ICD-10-CM

## 2016-08-29 DIAGNOSIS — O403XX0 Polyhydramnios, third trimester, not applicable or unspecified: Secondary | ICD-10-CM | POA: Diagnosis not present

## 2016-08-29 DIAGNOSIS — Z789 Other specified health status: Secondary | ICD-10-CM

## 2016-08-29 DIAGNOSIS — Z91199 Patient's noncompliance with other medical treatment and regimen due to unspecified reason: Secondary | ICD-10-CM

## 2016-08-29 DIAGNOSIS — O24415 Gestational diabetes mellitus in pregnancy, controlled by oral hypoglycemic drugs: Secondary | ICD-10-CM | POA: Diagnosis not present

## 2016-08-29 DIAGNOSIS — O09899 Supervision of other high risk pregnancies, unspecified trimester: Secondary | ICD-10-CM

## 2016-08-29 DIAGNOSIS — O280 Abnormal hematological finding on antenatal screening of mother: Secondary | ICD-10-CM

## 2016-08-29 NOTE — Progress Notes (Signed)
#  [id number] trigrinya intrepreter used  Dr. Burnice LoganHarrawayKatrinka Blazing- Smith at bedside with patient and patient is agreeable to induction. Patient given induction scheduled tonight at 12 am and patient aware to come in at 11:30 pm. Armandina StammerJennifer Howard RNBSN

## 2016-08-29 NOTE — Progress Notes (Signed)
   PRENATAL VISIT NOTE  Subjective:  Vickie Prince is a 36 y.o. G2P1001 at 4534w2d being seen today for ongoing prenatal care.  She is currently monitored for the following issues for this high-risk pregnancy and has Abnormal maternal serum screening test; Supervision of other high risk pregnancy, antepartum; Non-English speaking patient; Gestational diabetes mellitus (GDM) in third trimester; Pregnancy complicated by noncompliance, antepartum, third trimester; and Polyhydramnios affecting pregnancy in third trimester on her problem list.  Patient reports no complaints.  Contractions: Irritability. Vag. Bleeding: None.  Movement: Present. Denies leaking of fluid.   The following portions of the patient's history were reviewed and updated as appropriate: allergies, current medications, past family history, past medical history, past social history, past surgical history and problem list. Problem list updated.  Objective:   Vitals:   08/29/16 1038  Weight: 155 lb (70.3 kg)    Fetal Status: Fetal Heart Rate (bpm): NST   Movement: Present     General:  Alert, oriented and cooperative. Patient is in no acute distress.  Skin: Skin is warm and dry. No rash noted.   Cardiovascular: Normal heart rate noted  Respiratory: Normal respiratory effort, no problems with respiration noted  Abdomen: Soft, gravid, appropriate for gestational age. Pain/Pressure: Present     Pelvic:  Cervical exam deferred        Extremities: Normal range of motion.  Edema: None  Mental Status: Normal mood and affect. Normal behavior. Normal judgment and thought content.   Assessment and Plan:  Pregnancy: G2P1001 at 6734w2d  1. Supervision of other high risk pregnancy, antepartum   2. Pregnancy complicated by noncompliance, antepartum, third trimester   3. Polyhydramnios affecting pregnancy in third trimester   4. Non-English speaking patient   5. Gestational diabetes mellitus (GDM) in third trimester controlled  on oral hypoglycemic drug Pt has refused IOL in the past. Using the phone interpreter I spent a considerable amount of time reviewing what an IOL entails and reviewed her and her husbands fears and concerns. The risks including death in utero were explained.  All of their questions were answered.  They have agreed to come in this evening for an induction.      Orders placed.  Her glc has been well controlled on meds  6. Abnormal maternal serum screening test  NST reviewed and reactive.   Term labor symptoms and general obstetric precautions including but not limited to vaginal bleeding, contractions, leaking of fluid and fetal movement were reviewed in detail with the patient. Please refer to After Visit Summary for other counseling recommendations.  F/u for PP visit in 2 1/2 weeks  Total face-to-face time with patient was 35 min.  Greater than 50% was spent in counseling and coordination of care with the patient.    Willodean Rosenthalarolyn Harraway-Smith, MD

## 2016-08-30 ENCOUNTER — Inpatient Hospital Stay (HOSPITAL_COMMUNITY)
Admission: RE | Admit: 2016-08-30 | Discharge: 2016-09-01 | DRG: 775 | Disposition: A | Discharge status: Home / Self Care | Payer: Medicaid Other | Source: Ambulatory Visit | Attending: Obstetrics and Gynecology | Admitting: Obstetrics and Gynecology

## 2016-08-30 ENCOUNTER — Encounter (HOSPITAL_COMMUNITY): Payer: Self-pay

## 2016-08-30 DIAGNOSIS — Z3A39 39 weeks gestation of pregnancy: Secondary | ICD-10-CM

## 2016-08-30 DIAGNOSIS — O24415 Gestational diabetes mellitus in pregnancy, controlled by oral hypoglycemic drugs: Secondary | ICD-10-CM

## 2016-08-30 DIAGNOSIS — O403XX0 Polyhydramnios, third trimester, not applicable or unspecified: Secondary | ICD-10-CM | POA: Diagnosis present

## 2016-08-30 DIAGNOSIS — O24419 Gestational diabetes mellitus in pregnancy, unspecified control: Secondary | ICD-10-CM | POA: Diagnosis present

## 2016-08-30 DIAGNOSIS — O24425 Gestational diabetes mellitus in childbirth, controlled by oral hypoglycemic drugs: Principal | ICD-10-CM | POA: Diagnosis present

## 2016-08-30 LAB — CBC
HEMATOCRIT: 33.6 % — AB (ref 36.0–46.0)
HEMOGLOBIN: 11.6 g/dL — AB (ref 12.0–15.0)
MCH: 30.1 pg (ref 26.0–34.0)
MCHC: 34.5 g/dL (ref 30.0–36.0)
MCV: 87 fL (ref 78.0–100.0)
Platelets: 115 10*3/uL — ABNORMAL LOW (ref 150–400)
RBC: 3.86 MIL/uL — AB (ref 3.87–5.11)
RDW: 14.1 % (ref 11.5–15.5)
WBC: 7.7 10*3/uL (ref 4.0–10.5)

## 2016-08-30 LAB — GLUCOSE, CAPILLARY
GLUCOSE-CAPILLARY: 76 mg/dL (ref 65–99)
GLUCOSE-CAPILLARY: 68 mg/dL (ref 65–99)
GLUCOSE-CAPILLARY: 67 mg/dL (ref 65–99)
Glucose-Capillary: 94 mg/dL (ref 65–99)
Glucose-Capillary: 68 mg/dL (ref 65–99)

## 2016-08-30 LAB — RPR: RPR Ser Ql: NONREACTIVE

## 2016-08-30 LAB — CORD BLOOD GAS (ARTERIAL)
Bicarbonate: 19.3 mmol/L — ABNORMAL LOW (ref 20.0–28.0)
PCO2 CORD BLOOD: 34.1 mmHg — AB (ref 42.0–56.0)
pH cord blood (arterial): 7.371 (ref 7.210–7.380)

## 2016-08-30 MED ORDER — OXYCODONE-ACETAMINOPHEN 5-325 MG PO TABS: 1.0000 | ORAL_TABLET | ORAL | Status: DC | PRN

## 2016-08-30 MED ORDER — LACTATED RINGERS IV SOLN
INTRAVENOUS | Status: DC
  Administered 2016-08-30 (×4): via INTRAVENOUS

## 2016-08-30 MED ORDER — OXYTOCIN 40 UNITS IN LACTATED RINGERS INFUSION - SIMPLE MED: 1.0000 m[IU]/min | INTRAVENOUS | Status: DC

## 2016-08-30 MED ORDER — TERBUTALINE SULFATE 1 MG/ML IJ SOLN
0.2500 mg | Freq: Once | INTRAMUSCULAR | Status: DC | PRN
  Filled 2016-08-30: qty 1

## 2016-08-30 MED ORDER — LACTATED RINGERS IV SOLN
500.0000 mL | INTRAVENOUS | Status: DC | PRN
  Administered 2016-08-30: 500 mL via INTRAVENOUS

## 2016-08-30 MED ORDER — FENTANYL CITRATE (PF) 100 MCG/2ML IJ SOLN
50.0000 ug | INTRAMUSCULAR | Status: DC | PRN
  Administered 2016-08-30: 100 ug via INTRAVENOUS
  Filled 2016-08-30: qty 2

## 2016-08-30 MED ORDER — SOD CITRATE-CITRIC ACID 500-334 MG/5ML PO SOLN: 30.0000 mL | ORAL | Status: DC | PRN

## 2016-08-30 MED ORDER — OXYCODONE-ACETAMINOPHEN 5-325 MG PO TABS: 2.0000 | ORAL_TABLET | ORAL | Status: DC | PRN

## 2016-08-30 MED ORDER — ONDANSETRON HCL 4 MG/2ML IJ SOLN: 4.0000 mg | Freq: Four times a day (QID) | INTRAMUSCULAR | Status: DC | PRN

## 2016-08-30 MED ORDER — OXYTOCIN BOLUS FROM INFUSION
500.0000 mL | Freq: Once | INTRAVENOUS | Status: AC
  Administered 2016-08-30: 500 mL via INTRAVENOUS

## 2016-08-30 MED ORDER — MISOPROSTOL 25 MCG QUARTER TABLET
25.0000 ug | ORAL_TABLET | ORAL | Status: DC | PRN
  Administered 2016-08-30: 25 ug via VAGINAL
  Filled 2016-08-30 (×2): qty 1

## 2016-08-30 MED ORDER — NALOXONE HCL 0.4 MG/ML IJ SOLN
INTRAMUSCULAR | Status: AC
  Filled 2016-08-30: qty 1

## 2016-08-30 MED ORDER — ACETAMINOPHEN 325 MG PO TABS: 650.0000 mg | ORAL_TABLET | ORAL | Status: DC | PRN

## 2016-08-30 MED ORDER — LIDOCAINE HCL (PF) 1 % IJ SOLN
30.0000 mL | INTRAMUSCULAR | Status: DC | PRN
  Administered 2016-08-30: 30 mL via SUBCUTANEOUS
  Filled 2016-08-30: qty 30

## 2016-08-30 MED ORDER — OXYTOCIN 40 UNITS IN LACTATED RINGERS INFUSION - SIMPLE MED
1.0000 m[IU]/min | INTRAVENOUS | Status: DC
  Administered 2016-08-30: 2 m[IU]/min via INTRAVENOUS
  Filled 2016-08-30: qty 1000

## 2016-08-30 MED ORDER — FLEET ENEMA 7-19 GM/118ML RE ENEM: 1.0000 | ENEMA | RECTAL | Status: DC | PRN

## 2016-08-30 MED ORDER — IBUPROFEN 600 MG PO TABS
600.0000 mg | ORAL_TABLET | Freq: Four times a day (QID) | ORAL | Status: DC
  Administered 2016-08-30 – 2016-09-01 (×6): 600 mg via ORAL
  Filled 2016-08-30 (×6): qty 1

## 2016-08-30 MED ORDER — OXYTOCIN 40 UNITS IN LACTATED RINGERS INFUSION - SIMPLE MED
2.5000 [IU]/h | INTRAVENOUS | Status: DC
  Administered 2016-08-30: 2.5 [IU]/h via INTRAVENOUS

## 2016-08-30 NOTE — Progress Notes (Signed)
Vickie Prince is a 36 y.o. G2P1001 at 4846w3d by LMP admitted for induction of labor due to Hydramnios and Gestational diabetes.  Subjective:   Objective: BP 108/64   Pulse 70   Temp 98.4 F (36.9 C) (Oral)   Resp 18   Ht 5\' 6"  (1.676 m)   Wt 70.3 kg (155 lb)   LMP 11/28/2015   BMI 25.02 kg/m  No intake/output data recorded. No intake/output data recorded.  FHT:  FHR: 130 bpm, variability: moderate,  accelerations:  Present,  decelerations:  Absent UC:   regular, every 1-5 minutes SVE:   Dilation: 5 Effacement (%): 60 Station: -3 Exam by:: Lorretta Harp. Brown RNC AROM with copious amounts of clear fluid / IUPC paced without difficulty  Labs: Lab Results  Component Value Date   WBC 7.7 08/30/2016   HGB 11.6 (L) 08/30/2016   HCT 33.6 (L) 08/30/2016   MCV 87.0 08/30/2016   PLT 115 (L) 08/30/2016    Assessment / Plan: Induction of labor due to gestational diabetes and polyhydramnios,  progressing well on pitocin  Labor: Progressing on Pitocin, will continue to increase Preeclampsia:  n/a Fetal Wellbeing:  Category I Pain Control:  Labor support without medications I/D:  n/a Anticipated MOD:  NSVD  Raelyn Moraolitta Ashten Sarnowski, MSN, CNM 08/30/2016, 6:13 PM

## 2016-08-30 NOTE — Progress Notes (Addendum)
S: Patient seen & examined for progress of labor. Patient currently comfortable without complaints.   Dilation: 2 Effacement (%): 70 Cervical Position: Middle Station: Ballotable Presentation: Vertex Exam by:: Dorathy DaftShay Payne RN    FHT: 120 bpm, mod var, +accels, no decels TOCO: Irregular, q2273m   A/P: Labor: Progressing with cytotec and foley  Pain: epidural FWB: Category I   Continue expectant management Anticipate SVD

## 2016-08-30 NOTE — H&P (Signed)
Vickie Prince is a 36 y.o. female G2P1001 with IUP at 15w3dpresenting for IOL for A2DM (metformin) and polyhydramnios (AFI 25.87)  . P[facility]are at [facility] (transfer from HD)  Prenatal History/Complications:  AS0YTPolyhydramnios 1:82 DSR on AFP (declined NIPS)   Past Medical History: Past Medical History:  Diagnosis Date  . Gestational diabetes 2018  . Medical history non-contributory     Past Surgical History: Past Surgical History:  Procedure Laterality Date  . NO PAST SURGERIES      Obstetrical History: OB History    Gravida Para Term Preterm AB Living   2 1 1     1    SAB TAB Ectopic Multiple Live Births           1      Social History: Social History   Social History  . Marital status: Married    Spouse name: N/A  . Number of children: N/A  . Years of education: N/A   Social History Main Topics  . Smoking status: Never Smoker  . Smokeless tobacco: Never Used  . Alcohol use No  . Drug use: No  . Sexual activity: Yes    Birth control/ protection: None   Other Topics Concern  . Not on file   Social History Narrative  . No narrative on file    Family History: No family history on file.  Allergies: No Known Allergies  Prescriptions Prior to Admission  Medication Sig Dispense Refill Last Dose  . ACCU-CHEK FASTCLIX LANCETS MISC 1 Units by Percutaneous route 4 (four) times daily. 100 each 12 Taking  . Blood Glucose Monitoring Suppl (ACCU-CHEK NANO SMARTVIEW) w/Device KIT 1 kit by Subdermal route as directed. Check blood sugars for fasting, and two hours after breakfast, lunch and dinner (4 checks daily) 1 kit 0 Taking  . glucose blood test strip accucheck Aviva Test strips. Use to check blood sugars four times daily. 100 each 12 Taking  . metFORMIN (GLUCOPHAGE) 500 MG tablet Take 1 tablet (500 mg total) by mouth daily with breakfast. 30 tablet 5 Taking  . Prenatal Vit w/Fe-Methylfol-FA (PNV PO) Take by mouth.   Taking        Review of Systems    Constitutional: Negative for fever and chills Eyes: Negative for visual disturbances Respiratory: Negative for shortness of breath, dyspnea Cardiovascular: Negative for chest pain or palpitations  Gastrointestinal: Negative for abdominal pain, vomiting, diarrhea and constipation.   Genitourinary: Negative for dysuria and urgency Musculoskeletal: Negative for back pain, joint pain, myalgias  Neurological: Negative for dizziness and headaches      Last menstrual period 11/28/2015. General appearance: alert, cooperative and no distress Lungs: clear to auscultation bilaterally Heart: regular rate and rhythm Abdomen: soft, non-tender; bowel sounds normal Extremities: Homans sign is negative, no sign of DVT DTR's 2+ Presentation: cephalic Fetal monitoring  Baseline: 140 bpm, Variability: Good {> 6 bpm), Accelerations: Reactive and Decelerations: Absent Uterine activity  None    EFW 52% last week  Prenatal labs: ABO, Rh: B/Negative/-- (11/27 0000) Antibody: Negative (11/27 0000) Rubella: immune RPR: Nonreactive (11/27 0000)  HBsAg: Negative (11/27 0000)  HIV: Non-reactive (11/27 0000)      Clinic  [facility] Prenatal Labs  Dating  LMP c/w  Blood type:  B Negative   Genetic Screen  Quad: 1:82  NIPS:declined Antibody:  negative  Anatomic UKoreaNormal Rubella:  Immune  GTT 3 hr: 07-08-16 failed fast:86 1hour:194  2hour:190  RPR:   negative  Flu vaccine 02-12-16 HBsAg:  negative  TDaP vaccine  07-03-16                                             Rhogam:07-03-16 HIV:   negative  Baby Food  breast                                              GBS: neg  Contraception  undecided Pap: negative  Circumcision     Pediatrician    Support Person  FOB      No results found for this or any previous visit (from the past 24 hour(s)).  Assessment: Vickie Prince is a 36 y.o. G2P1001 with an IUP at 13w3dpresenting for IOL for A2DM/polyhydramnios.  Plan: #Labor:  Cytotec->Foley->pitocin #Pain:  Per request #FWB Cat 1   CRESENZO-DISHMAN,Oaklynn Stierwalt 08/30/2016, 1:36 AM

## 2016-08-30 NOTE — Progress Notes (Signed)
Vitals:   08/30/16 0600 08/30/16 0745  BP: 126/75 112/78  Pulse: 80 62  Resp: 18 18  Temp:  98.1 F (36.7 C)   cx 1-2/50/-3.  Foley inserted.  FHR Cat 1.  Not feeling any ctx, but q 1-4 minutes on EFM.

## 2016-08-30 NOTE — Anesthesia Pain Management Evaluation Note (Signed)
  [facility] Pain Management Visit Note  Patient: Vickie Prince, 36 y.o., female  "Hello I am a [facility]. We have an anesthesia team available at all times to provide care throughout the hospital, including epidural management and anesthesia for C-section. I don't know your plan for the delivery whether it a natural birth, water birth, IV sedation, nitrous supplementation, doula or epidural, but we want to meet your pain goals."   1.Was your pain managed to your expectations on prior hospitalizations?   Yes   2.What is your expectation for pain management during this hospitalization?     Labor support without medications and IV pain meds  3.How can we help you reach that goal? Support through labor;interpreter available as needs evolve via phone;encouraged to hit call bell if RN not present;interpreter used via phone to discuss epidural option and other pain management options   Record the patient's initial score and the patient's pain goal.   Pain: 0  Pain Goal: 8 [facility] wants you to be able to say your pain was always managed very well.  Edison PaceWILKERSON,Vickie Prince 08/30/2016

## 2016-08-31 MED ORDER — BENZOCAINE-MENTHOL 20-0.5 % EX AERO
1.0000 "application " | INHALATION_SPRAY | CUTANEOUS | Status: DC | PRN
  Administered 2016-08-31: 1 via TOPICAL
  Filled 2016-08-31: qty 56

## 2016-08-31 MED ORDER — TETANUS-DIPHTH-ACELL PERTUSSIS 5-2.5-18.5 LF-MCG/0.5 IM SUSP: 0.5000 mL | Freq: Once | INTRAMUSCULAR | Status: DC

## 2016-08-31 MED ORDER — WITCH HAZEL-GLYCERIN EX PADS: 1.0000 "application " | MEDICATED_PAD | CUTANEOUS | Status: DC | PRN

## 2016-08-31 MED ORDER — DIBUCAINE 1 % RE OINT: 1.0000 "application " | TOPICAL_OINTMENT | RECTAL | Status: DC | PRN

## 2016-08-31 MED ORDER — SIMETHICONE 80 MG PO CHEW: 80.0000 mg | CHEWABLE_TABLET | ORAL | Status: DC | PRN

## 2016-08-31 MED ORDER — ONDANSETRON HCL 4 MG PO TABS: 4.0000 mg | ORAL_TABLET | ORAL | Status: DC | PRN

## 2016-08-31 MED ORDER — RHO D IMMUNE GLOBULIN 1500 UNIT/2ML IJ SOSY
300.0000 ug | PREFILLED_SYRINGE | Freq: Once | INTRAMUSCULAR | Status: AC
  Administered 2016-08-31: 300 ug via INTRAVENOUS
  Filled 2016-08-31: qty 2

## 2016-08-31 MED ORDER — PRENATAL MULTIVITAMIN CH
1.0000 | ORAL_TABLET | Freq: Every day | ORAL | Status: DC
  Administered 2016-08-31: 1 via ORAL
  Filled 2016-08-31: qty 1

## 2016-08-31 MED ORDER — SENNOSIDES-DOCUSATE SODIUM 8.6-50 MG PO TABS
2.0000 | ORAL_TABLET | ORAL | Status: DC
  Administered 2016-08-31: 2 via ORAL
  Filled 2016-08-31: qty 2

## 2016-08-31 MED ORDER — COCONUT OIL OIL
1.0000 "application " | TOPICAL_OIL | Status: DC | PRN
  Administered 2016-08-31: 1 via TOPICAL
  Filled 2016-08-31: qty 120

## 2016-08-31 MED ORDER — DIPHENHYDRAMINE HCL 25 MG PO CAPS: 25.0000 mg | ORAL_CAPSULE | Freq: Four times a day (QID) | ORAL | Status: DC | PRN

## 2016-08-31 MED ORDER — ACETAMINOPHEN 325 MG PO TABS: 650.0000 mg | ORAL_TABLET | ORAL | Status: DC | PRN

## 2016-08-31 MED ORDER — ONDANSETRON HCL 4 MG/2ML IJ SOLN: 4.0000 mg | INTRAMUSCULAR | Status: DC | PRN

## 2016-08-31 MED ORDER — ZOLPIDEM TARTRATE 5 MG PO TABS: 5.0000 mg | ORAL_TABLET | Freq: Every evening | ORAL | Status: DC | PRN

## 2016-08-31 NOTE — Plan of Care (Signed)
Problem: Role Relationship: Goal: Ability to demonstrate positive interaction with newborn will improve Outcome: Completed/Met Date Met: 08/31/16 Mother bonding with baby

## 2016-08-31 NOTE — Progress Notes (Signed)
PPD #1 S/P SVD  S:  Reports feeling well             Tolerating po/ No nausea or vomiting             Bleeding is light             Pain controlled withibuprofen (OTC)             Up ad lib / ambulatory / voiding QS  Newborn breast feeding  / Circumcision unsure  Successfully breastfeeding? Yes, however baby is also being treated for hyperbilirubinemia  Anesthesia in labor fentanyl  O:               VS: BP 129/74 (BP Location: Left Arm)   Pulse 93   Temp 98.3 F (36.8 C)   Resp 18   Ht 5\' 6"  (1.676 m)   Wt 155 lb (70.3 kg)   LMP 11/28/2015   SpO2 100%   Breastfeeding? Unknown   BMI 25.02 kg/m    LABS:              Recent Labs  08/30/16 0130  WBC 7.7  HGB 11.6*  PLT 115*               Blood type: --/--/B NEG (06/16 0537)  Rubella: Immune (11/27 0000)                     EBL:    I&O: Intake/Output      06/15 0701 - 06/16 0700 06/16 0701 - 06/17 0700   Urine (mL/kg/hr) 140 (0.1)    Blood 200 (0.1)    Total Output 340     Net -340          Urine Occurrence 2 x                  Physical Exam:             Alert and oriented X3  Lungs: Clear and unlabored  Heart: regular rate and rhythm / no mumurs  Abdomen: soft, non-tender, non-distended              Fundus: firm, non-tender, U-1  Perineum: intact but periurethral laceration repaired with 4-0 monocryl  Lochia: mild  Extremities: edema, no calf pain or tenderness    Induction/Augmentation? Foley and pit and cytotec  Medications given in labor? Fentanyl    A: PPD # 1  Doing well - stable status  Patient is Rhogam candidate; antiicipate Rhogam given before discharge   P: Routine post partum orders  PP OV in 6 weeks with [facility]  Special instructions/meds   Anticipate discharge on 6/17 pending baby status   Luna KitchensKathryn Dakiya Prince CNM 08/31/2016, 1:38 PM

## 2016-08-31 NOTE — Lactation Note (Signed)
This note was copied from a baby's chart. Lactation Consultation Note  Patient Name: Vickie Prince [id number]'WToday's Date: 08/31/2016 Reason for consult: Initial assessment    With this mom of a term baby, now 6718 hours old, and has hyperbilirubinemia, under triple phototherapy. The baby last fed at 10 am. Mom is an experienced breast feeder. With the help of a Therapist, occupationalTigrinyan  Interpreter # [phone] from the [employer] phone line, mom was taught how to pump, and HE.  Mom was able to express 10 mls of colostrum. The baby's suck was very uncoordinated at first. She did take about 8 ml's of the colostrum, and spit out about 2 ml's, but then began sucking well, and took an additional 10 ml's of Alimentum, with slow flow nipple. Mom knows to pump every 3 hours, to try and breast feed if able, and then bottle feed EBm and the formula, at least 20 ml's every 3 hour, or more with cues. Mom also knows to keep an accurate intake and output on baby.   Maternal Data Formula Feeding for Exclusion: No Has patient been taught Hand Expression?: Yes Does the patient have breastfeeding experience prior to this delivery?: Yes  Feeding Feeding Type: Formula Nipple Type: Slow - flow  LATCH Score/Interventions Latch: Too sleepy or reluctant, no latch achieved, no sucking elicited.     Type of Nipple: Everted at rest and after stimulation  Comfort (Breast/Nipple): Soft / non-tender           Lactation Tools Discussed/Used Tools: Bottle Pump Review: Setup, frequency, and cleaning;Milk Storage;Other (comment) (pump settings, HE, part reassembly) Initiated by:: Rangel Echeverri Nedra HaiLee RN IBCLC Date initiated:: 08/31/16   Consult Status Consult Status: Follow-up Date: 09/01/16 Follow-up type: In-patient    Alfred LevinsLee, Sayyid Harewood Anne 08/31/2016, 4:42 PM

## 2016-09-01 LAB — RH IG WORKUP (INCLUDES ABO/RH)
ABO/RH(D): B NEG
Fetal Screen: NEGATIVE
Gestational Age(Wks): 39
Unit division: 0

## 2016-09-01 MED ORDER — IBUPROFEN 600 MG PO TABS: 600.0000 mg | ORAL_TABLET | Freq: Four times a day (QID) | ORAL | 0 refills | Status: DC

## 2016-09-01 NOTE — Discharge Summary (Signed)
OB Discharge Summary  Patient Name: Vickie Prince DOB: [birth year] MRN: [id number]  Date of admission: 08/30/2016 Delivering MD: Seabron Spates   Date of discharge: 09/01/2016  Admitting diagnosis: INDUCTION Intrauterine pregnancy: [redacted]w[redacted]d    Secondary diagnosis:Active Problems:   Gestational diabetes  Additional problems:none     Discharge diagnosis: Term Pregnancy Delivered and GDM A2                                                                     Post partum procedures:n/a  Augmentation: Pitocin and Cytotec  Complications: None  Hospital course:  Induction of Labor With Vaginal Delivery   36y.o. yo G2P2002 at 34w3das admitted to the hospital 08/30/2016 for induction of labor.  Indication for induction: A2 DM.  Patient had an uncomplicated labor course as follows: Membrane Rupture Time/Date: 6:04 PM ,08/30/2016   Intrapartum Procedures: Episiotomy: None [1]                                         Lacerations:  Periurethral [8]  Patient had delivery of a Viable infant.  Information for the patient's newborn:  GeDarlys, Buis0[[id number]]Delivery Method: Vag-Spont   08/30/2016  Details of delivery can be found in separate delivery note.  Patient had a routine postpartum course. Patient is discharged home 09/01/16.  Physical exam  Vitals:   08/31/16 0010 08/31/16 0400 08/31/16 1807 09/01/16 0552  BP: 90/70 129/74 118/66 108/70  Pulse: 95 93 100 100  Resp: _0 Temp: 97.5 F (36.4 C) 98.3 F (36.8 C) 98.3 F (36.8 C) 98.5 F (36.9 C)  TempSrc: Oral  Oral Oral  SpO2: 100%     Weight:      Height:       General: alert, cooperative and no distress Lochia: appropriate Uterine Fundus: firm Incision: N/A DVT Evaluation: No evidence of DVT seen on physical exam. Labs: Lab Results  Component Value Date   WBC 7.7 08/30/2016   HGB 11.6 (L) 08/30/2016   HCT 33.6 (L) 08/30/2016   MCV 87.0 08/30/2016   PLT 115 (L) 08/30/2016   No flowsheet  data found.  Discharge instruction: per After Visit Summary and "Baby and Me Booklet".  After Visit Meds:  Allergies as of 09/01/2016   No Known Allergies     Medication List    STOP taking these medications   ACCU-CHEK FASTCLIX LANCETS Misc   ACCU-CHEK NANO SMARTVIEW w/Device Kit   glucose blood test strip   metFORMIN 500 MG tablet Commonly known as:  GLUCOPHAGE     TAKE these medications   ibuprofen 600 MG tablet Commonly known as:  ADVIL,MOTRIN Take 1 tablet (600 mg total) by mouth every 6 (six) hours.   PNV PO Take 1 tablet by mouth daily.       Diet: carb modified diet  Activity: Advance as tolerated. Pelvic rest for 6 weeks.   Outpatient follow up:6 weeks Follow up Appt:Future Appointments Date Time Provider [place]7/20/2018 8:45 AM HaLavonia DraftsMD [facility] None   Follow up visit: No Follow-up on file.  Postpartum  contraception: Undecided  Newborn Data: Live born female  Birth Weight: 6 lb 14.6 oz (3135 g) APGAR: 8, 9  Baby Feeding: Breast Disposition:home with mother   09/01/2016 Koren Shiver, CNM

## 2016-09-01 NOTE — Lactation Note (Signed)
This note was copied from a baby's chart. Lactation Consultation Note  Patient Name: Vickie Prince: 09/01/2016 Reason for consult: Follow-up assessment   With this mom of a term baby,. Under triple photherapy. Mom is sitting in a chair, with the baby on triple phototherapy, and she is easily breastfeeding the baby. Mom shows me her milk has transitioned in, with hand expression. I told mom to keep breast feeding, and she no longer needs to pump at his time. I also told her that the doctor has not written a note yet, and it it's  up to him as to whether or not the baby can go home today. Dad interpreted for mom.    Maternal Data    Feeding Feeding Type: Breast Fed  LATCH Score/Interventions Latch: Grasps breast easily, tongue down, lips flanged, rhythmical sucking.  Audible Swallowing: Spontaneous and intermittent  Type of Nipple: Everted at rest and after stimulation  Comfort (Breast/Nipple): Soft / non-tender     Hold (Positioning): No assistance needed to correctly position infant at breast.  LATCH Score: 10  Lactation Tools Discussed/Used     Consult Status Consult Status: Complete Follow-up type: Call as needed    Alfred LevinsLee, Keyetta Hollingworth Anne 09/01/2016, 11:58 AM

## 2016-09-01 NOTE — Progress Notes (Signed)
Used Tigrinyan interpreter # [id number], Mimi, to go over discharge instructions with patient. Dr Manson PasseyBrown present at the end of teaching and explained to MOB that baby's bili levels were evening out and that we could remove one of the lights. Patient verbalizes understanding of discharge instructions and encouraged to call RN with questions/concerns. Sherald BargeMatthews, Draven Natter L

## 2016-09-02 ENCOUNTER — Ambulatory Visit: Payer: Self-pay

## 2016-09-02 ENCOUNTER — Other Ambulatory Visit: Payer: Self-pay

## 2016-09-02 NOTE — Lactation Note (Signed)
This note was copied from a baby's chart. Lactation Consultation Note: Mother lying flat on her back with infant skin to skin and infants head covered. Advised mother not to cover infants face. Husband phoned into room and was placed on speaker phone to interpret for Mother.  Mother reports that infant is feeding well and she hears infant swallow. Mother hand expressed to show me that her milk was coming in. Observed good flow of milk. Advised mother to continue to cue base feed infant and at least 8-12 times in 24 hours. Mother is active with WIC. She was advised to schedule an appt for her infant. Mother was offered a manuel hand pump. Mother declined. Mother informed of LC phone number on the back of the brochure. Mother denies having any questions or concerns.   Patient Name: Vickie Prince [id number]'s Date: 09/02/2016 Reason for consult: Follow-up assessment   Maternal Data    Feeding    LATCH Score/Interventions                      Lactation Tools Discussed/Used     Consult Status Consult Status: Complete    Michel BickersKendrick, Diavion Labrador McCoy 09/02/2016, 11:45 AM

## 2016-09-03 LAB — TYPE AND SCREEN
ABO/RH(D): B NEG
Antibody Screen: POSITIVE
DAT, IgG: NEGATIVE
UNIT DIVISION: 0
UNIT DIVISION: 0

## 2016-09-03 LAB — BPAM RBC
BLOOD PRODUCT EXPIRATION DATE: 201807012359
Blood Product Expiration Date: 201807102359
UNIT TYPE AND RH: 9500
Unit Type and Rh: 9500

## 2016-09-05 ENCOUNTER — Encounter: Payer: Self-pay | Admitting: Family Medicine

## 2016-10-04 ENCOUNTER — Ambulatory Visit (INDEPENDENT_AMBULATORY_CARE_PROVIDER_SITE_OTHER): Payer: Medicaid Other | Admitting: Obstetrics & Gynecology

## 2016-10-04 DIAGNOSIS — Z30017 Encounter for initial prescription of implantable subdermal contraceptive: Secondary | ICD-10-CM

## 2016-10-04 DIAGNOSIS — Z3049 Encounter for surveillance of other contraceptives: Secondary | ICD-10-CM

## 2016-10-04 MED ORDER — ETONOGESTREL 68 MG ~~LOC~~ IMPL
68.0000 mg | DRUG_IMPLANT | Freq: Once | SUBCUTANEOUS | Status: AC
  Administered 2016-10-04: 68 mg via SUBCUTANEOUS

## 2016-10-04 NOTE — Patient Instructions (Signed)
Etonogestrel implant What is this medicine? ETONOGESTREL (et oh noe JES trel) is a contraceptive (birth control) device. It is used to prevent pregnancy. It can be used for up to 3 years. This medicine may be used for other purposes; ask your health care provider or pharmacist if you have questions. COMMON BRAND NAME(S): Implanon, Nexplanon What should I tell my health care provider before I take this medicine? They need to know if you have any of these conditions: -abnormal vaginal bleeding -blood vessel disease or blood clots -cancer of the breast, cervix, or liver -depression -diabetes -gallbladder disease -headaches -heart disease or recent heart attack -high blood pressure -high cholesterol -kidney disease -liver disease -renal disease -seizures -tobacco smoker -an unusual or allergic reaction to etonogestrel, other hormones, anesthetics or antiseptics, medicines, foods, dyes, or preservatives -pregnant or trying to get pregnant -breast-feeding How should I use this medicine? This device is inserted just under the skin on the inner side of your upper arm by a health care professional. Talk to your pediatrician regarding the use of this medicine in children. Special care may be needed. Overdosage: If you think you have taken too much of this medicine contact a poison control center or emergency room at once. NOTE: This medicine is only for you. Do not share this medicine with others. What if I miss a dose? This does not apply. What may interact with this medicine? Do not take this medicine with any of the following medications: -amprenavir -bosentan -fosamprenavir This medicine may also interact with the following medications: -barbiturate medicines for inducing sleep or treating seizures -certain medicines for fungal infections like ketoconazole and itraconazole -grapefruit juice -griseofulvin -medicines to treat seizures like carbamazepine, felbamate, oxcarbazepine,  phenytoin, topiramate -modafinil -phenylbutazone -rifampin -rufinamide -some medicines to treat HIV infection like atazanavir, indinavir, lopinavir, nelfinavir, tipranavir, ritonavir -St. John's wort This list may not describe all possible interactions. Give your health care provider a list of all the medicines, herbs, non-prescription drugs, or dietary supplements you use. Also tell them if you smoke, drink alcohol, or use illegal drugs. Some items may interact with your medicine. What should I watch for while using this medicine? This product does not protect you against HIV infection (AIDS) or other sexually transmitted diseases. You should be able to feel the implant by pressing your fingertips over the skin where it was inserted. Contact your doctor if you cannot feel the implant, and use a non-hormonal birth control method (such as condoms) until your doctor confirms that the implant is in place. If you feel that the implant may have broken or become bent while in your arm, contact your healthcare provider. What side effects may I notice from receiving this medicine? Side effects that you should report to your doctor or health care professional as soon as possible: -allergic reactions like skin rash, itching or hives, swelling of the face, lips, or tongue -breast lumps -changes in emotions or moods -depressed mood -heavy or prolonged menstrual bleeding -pain, irritation, swelling, or bruising at the insertion site -scar at site of insertion -signs of infection at the insertion site such as fever, and skin redness, pain or discharge -signs of pregnancy -signs and symptoms of a blood clot such as breathing problems; changes in vision; chest pain; severe, sudden headache; pain, swelling, warmth in the leg; trouble speaking; sudden numbness or weakness of the face, arm or leg -signs and symptoms of liver injury like dark yellow or brown urine; general ill feeling or flu-like symptoms;  light-colored   stools; loss of appetite; nausea; right upper belly pain; unusually weak or tired; yellowing of the eyes or skin -unusual vaginal bleeding, discharge -signs and symptoms of a stroke like changes in vision; confusion; trouble speaking or understanding; severe headaches; sudden numbness or weakness of the face, arm or leg; trouble walking; dizziness; loss of balance or coordination Side effects that usually do not require medical attention (report to your doctor or health care professional if they continue or are bothersome): -acne -back pain -breast pain -changes in weight -dizziness -general ill feeling or flu-like symptoms -headache -irregular menstrual bleeding -nausea -sore throat -vaginal irritation or inflammation This list may not describe all possible side effects. Call your doctor for medical advice about side effects. You may report side effects to FDA at 1-800-FDA-1088. Where should I keep my medicine? This drug is given in a hospital or clinic and will not be stored at home. NOTE: This sheet is a summary. It may not cover all possible information. If you have questions about this medicine, talk to your doctor, pharmacist, or health care provider.  2018 Elsevier/Gold Standard (2015-09-21 11:19:22) Nexplanon Instructions After Insertion   Keep bandage clean and dry for 24 hours   May use ice/Tylenol/Ibuprofen for soreness or pain   If you develop fever, drainage or increased warmth from incision site-contact office immediately   

## 2016-10-04 NOTE — Addendum Note (Signed)
Addended by: Anell BarrHOWARD, JENNIFER L on: 10/04/2016 09:57 AM   Modules accepted: Orders

## 2016-10-04 NOTE — Progress Notes (Signed)
Post Partum Exam  Interpreter [phone]#[id number]  Vickie Prince is a 36 y.o. 672P2002 female who presents for a postpartum visit. She is 5 weeks postpartum following a spontaneous vaginal delivery. I have fully reviewed the prenatal and intrapartum course. The delivery was at 39.3gestational weeks.  Anesthesia: paracervical block. Postpartum course has been uneventful. Baby's course has been uneventful. Baby is feeding by breast. Bleeding no bleeding. Bowel function is normal. Bladder function is normal. Patient is sexually active. Contraception method is abstinence. Postpartum depression screening:neg score 2  The following portions of the patient's history were reviewed and updated as appropriate: allergies, current medications, past family history, past medical history, past social history, past surgical history and problem list.  Review of Systems Pertinent items are noted in HPI.    Objective:  unknown if currently breastfeeding. BP 111/66 (BP Location: Left Arm)   Pulse 76   Wt 146 lb (66.2 kg)   Breastfeeding? Yes   BMI 23.57 kg/m  Pt in NAD CONSTITUTIONAL: Well-developed, well-nourished female in no acute distress.  HENT:  Normocephalic, atraumatic EYES: Conjunctivae and EOM are normal. No scleral icterus.  NECK: Normal range of motion SKIN: Skin is warm and dry. No rash noted. Not diaphoretic.No pallor. NEUROLGIC: Alert and oriented to person, place, and time. Normal coordination.  GU: EGBUS: no lesions; perineum intact Vagina: no blood in vault Cervix: no lesion; no mucopurulent d/c Uterus: small, mobile Adnexa: no masses; non tender   Patient given informed consent, she signed consent form. Pregnancy test was negative.  Appropriate time out taken.  Patient's left arm was prepped and draped in the usual sterile fashion.. The ruler used to measure and mark insertion area.  Patient was prepped with alcohol swab and then injected with 5 ml of 1 % lidocaine.  She was prepped with betadine,  Nexplanon removed from packaging,  Device confirmed in needle, then inserted full length of needle and withdrawn per handbook instructions.  There was minimal blood loss.  Patient insertion site covered with guaze and a pressure bandage to reduce any bruising.  The patient tolerated the procedure well and was given post procedure instructions.   Assessment:   5 week postpartum exam.  Nexplanon placed H/o GDM A2  Plan:   1. Contraception: Nexplanon 2. F/u in 1 week for 2 hour GTT 3. Follow up in: 1 year for annual or sooner prn or as needed.

## 2016-10-08 ENCOUNTER — Other Ambulatory Visit: Payer: Medicaid Other

## 2016-10-08 DIAGNOSIS — R7309 Other abnormal glucose: Secondary | ICD-10-CM

## 2016-10-08 NOTE — Progress Notes (Signed)
Patient presents for 2 hr postpartum glucose testing. Patient sent to lab. Armandina StammerJennifer Bev Drennen RNBSN

## 2016-10-09 LAB — GLUCOSE TOLERANCE, 2 HOURS W/ 1HR
GLUCOSE, FASTING: 96 mg/dL — AB (ref 65–91)
Glucose, 1 hour: 181 mg/dL — ABNORMAL HIGH (ref 65–179)
Glucose, 2 hour: 91 mg/dL (ref 65–152)

## 2016-10-10 ENCOUNTER — Other Ambulatory Visit: Payer: Self-pay | Admitting: Family Medicine

## 2016-10-10 DIAGNOSIS — O280 Abnormal hematological finding on antenatal screening of mother: Secondary | ICD-10-CM

## 2016-10-15 ENCOUNTER — Telehealth: Payer: Self-pay

## 2016-10-15 NOTE — Telephone Encounter (Signed)
Called patient for pre visit information needed. Husband states he is at work and will have to tell patient when he get home. Left message with him yesterday to have her call back however patient did not.

## 2016-10-16 ENCOUNTER — Ambulatory Visit (INDEPENDENT_AMBULATORY_CARE_PROVIDER_SITE_OTHER): Payer: Medicaid Other | Admitting: Family Medicine

## 2016-10-16 ENCOUNTER — Encounter: Payer: Self-pay | Admitting: Family Medicine

## 2016-10-16 VITALS — BP 110/68 | HR 80 | Temp 98.1°F | Ht 67.5 in | Wt 147.0 lb

## 2016-10-16 DIAGNOSIS — Z8632 Personal history of gestational diabetes: Secondary | ICD-10-CM | POA: Diagnosis not present

## 2016-10-16 LAB — HEMOGLOBIN A1C: HEMOGLOBIN A1C: 5.7 % (ref 4.6–6.5)

## 2016-10-16 NOTE — Patient Instructions (Signed)
Give us until next week to get the results of your labs.  Further management will be based on the results of your results.  Let us know if you need anything.

## 2016-10-16 NOTE — Progress Notes (Signed)
Chief Complaint  Patient presents with  . Establish Care    pt need to f/u on elevated BS       New Patient Visit SUBJECTIVE: HPI: Vickie Prince is an 36 y.o.female who is being seen for establishing care.  The patient was previously seen at her OB/GYN, she has never had a PCP. She requires an interpreter.She is here with her husband.  The patient had a child in the middle of June. She had follow-up with her OB/GYN and had a positive to our glucose tolerance test at 1 hour and fasting (96). She did have gestational diabetes. She was checking her sugars routinely during pregnancy. She is no longer doing this. She has no family history of diabetes. No increased drinking, urination, weight changes, numbness/tingling, or vision changes.  No Known Allergies  Past Medical History:  Diagnosis Date  . Gestational diabetes 2018   Past Surgical History:  Procedure Laterality Date  . NO PAST SURGERIES     Social History   Social History  . Marital status: Married   Social History Main Topics  . Smoking status: Never Smoker  . Smokeless tobacco: Never Used  . Alcohol use No  . Drug use: No  . Sexual activity: Yes    Birth control/ protection: None   Family History  Problem Relation Age of Onset  . Diabetes Neg Hx     Takes no medications routinely.  ROS Cardiovascular: Denies chest pain  Respiratory: Denies dyspnea   OBJECTIVE: BP 110/68 (BP Location: Left Arm, Patient Position: Sitting, Cuff Size: Normal)   Pulse 80   Temp 98.1 F (36.7 C) (Oral)   Ht 5' 7.5" (1.715 m)   Wt 147 lb (66.7 kg)   SpO2 99%   BMI 22.68 kg/m   Constitutional: -  VS reviewed -  Well developed, well nourished, appears stated age -  No apparent distress  Psychiatric: -  Oriented to person, place, and time -  Memory intact -  Affect and mood normal -  Fluent conversation, good eye contact -  Judgment and insight age appropriate  Eye: -  Conjunctivae clear, no discharge -  Pupils  symmetric, round, reactive to light  ENMT: -  Oral mucosa without lesions, tongue and uvula midline    Tonsils not enlarged, no erythema, no exudate, trachea midline    Pharynx moist, no lesions, no erythema  Neck: -  No gross swelling, no palpable masses -  Thyroid midline, not enlarged, mobile, no palpable masses  Cardiovascular: -  RRR, no murmurs -  No LE edema  Respiratory: -  Normal respiratory effort, no accessory muscle use, no retraction -  Breath sounds equal, no wheezes, no ronchi, no crackles  Gastrointestinal: -  Bowel sounds normal -  No tenderness, no distention, no guarding, no masses  Skin: -  No significant lesion on inspection -  Warm and dry to palpation   ASSESSMENT/PLAN: History of gestational diabetes - Plan: Hemoglobin A1c  Patient instructed to sign release of records form from her previous PCP. The glucose tolerance test done at the end of July does not meet criteria for the diagnosis of diabetes. I will obtain an A1c today. Would like a letter sent regarding results. Patient should return if she is diabetic. Prn otherwise.  The patient voiced understanding and agreement to the plan.   Jilda Rocheicholas Paul [place], DO 10/16/16  10:12 AM

## 2016-11-01 ENCOUNTER — Other Ambulatory Visit: Payer: Self-pay | Admitting: *Deleted

## 2016-11-01 ENCOUNTER — Encounter: Payer: Self-pay | Admitting: *Deleted

## 2016-11-01 DIAGNOSIS — R7303 Prediabetes: Secondary | ICD-10-CM

## 2018-04-02 IMAGING — US US MFM OB FOLLOW-UP
1 series · 13 of 28 positions shown · non-contrast
Comparison: none

for [REDACTED]care

Indications
38 weeks gestation of pregnancy
Abnormal biochemical screen (quad) for
Trisomy 21 ([DATE]) - declined further testing
Gestational diabetes in pregnancy,
controlled by oral hypoglycemic drugs
(metformin)
Non-reactive NST
OB History
Gravidity:    2         Term:   1        Prem:   0        SAB:   0
TOP:          0       Ectopic:  0        Living: 1
Fetal Evaluation
Num Of Fetuses:     1
Fetal Heart         137
Rate(bpm):
Cardiac Activity:   Observed
Presentation:       Cephalic
Placenta:           Anterior, above cervical os
P. Cord Insertion:  Visualized, central
Amniotic Fluid
AFI FV:      Polyhydramnios
AFI Sum(cm)     %Tile       Largest Pocket(cm)
25.87           97
RUQ(cm)       RLQ(cm)       LUQ(cm)        LLQ(cm)
6.02
Biophysical Evaluation
Amniotic F.V:   Polyhydramnios             F. Tone:        Observed
F. Movement:    Observed                   Score:          [DATE]
F. Breathing:   Observed
Biometry
BPD:      90.8  mm     G. Age:  36w 6d         35  %    CI:        73.54   %   70 - 86
FL/HC:      21.5   %   20.9 -
HC:      336.4  mm     G. Age:  38w 4d         39  %    HC/AC:      0.99       0.92 -
AC:      340.8  mm     G. Age:  38w 0d         60  %    FL/BPD:     79.7   %   71 - 87
FL:       72.4  mm     G. Age:  37w 1d         24  %    FL/AC:      21.2   %   20 - 24
HUM:        64  mm     G. Age:  37w 1d         55  %
Est. FW:    0550  gm      7 lb 4 oz     68  %
Gestational Age
LMP:           38w 2d       Date:   11/28/15                 EDD:   09/03/16
U/S Today:     37w 5d                                        EDD:   09/07/16
Best:          38w 2d    Det. By:   LMP  (11/28/15)          EDD:   09/03/16
Anatomy
Cranium:               Appears normal         Aortic Arch:            Previously seen
Cavum:                 Previously seen        Ductal Arch:            Previously seen
Ventricles:            Previously seen        Diaphragm:              Previously seen
Choroid Plexus:        Appears normal         Stomach:                Appears normal, left
sided
Cerebellum:            Previously seen        Abdomen:                Appears normal
Posterior Fossa:       Previously seen        Abdominal Wall:         Previously seen
Nuchal Fold:           Previously seen        Cord Vessels:           Appears normal (3
vessel cord)
Face:                  Appears normal         Kidneys:                Appear normal
(orbits and profile)
Lips:                  Appears normal         Bladder:                Appears normal
Thoracic:              Appears normal         Spine:                  Previously seen
Heart:                 Appears normal         Upper Extremities:      Previously seen
(4CH, axis, and
situs)
RVOT:                  Appears normal         Lower Extremities:      Previously seen
LVOT:                  Appears normal
Other:  Fetus appears to be a female. Heels and 5th digit previously
visualized. Nasal bone visualized.
Cervix Uterus Adnexa
Cervix
Not visualized (advanced GA >81wks)
Uterus
No abnormality visualized.
Left Ovary
Within normal limits.
Right Ovary
Not visualized.
Cul De Sac:   No free fluid seen.
Adnexa:       No abnormality visualized.
Impression
INDICATION: 36 yr old [id number] at 22w5d with gestational
diabetes A2 for fetal growth and BPP secondary to
nonreactive NST. Increased risk of trisomy 21 on quad
screen of [DATE].

[Series 1: us mfm ob follow-up · 60 acquisitions, 13 frames shown]
[im 3/60]
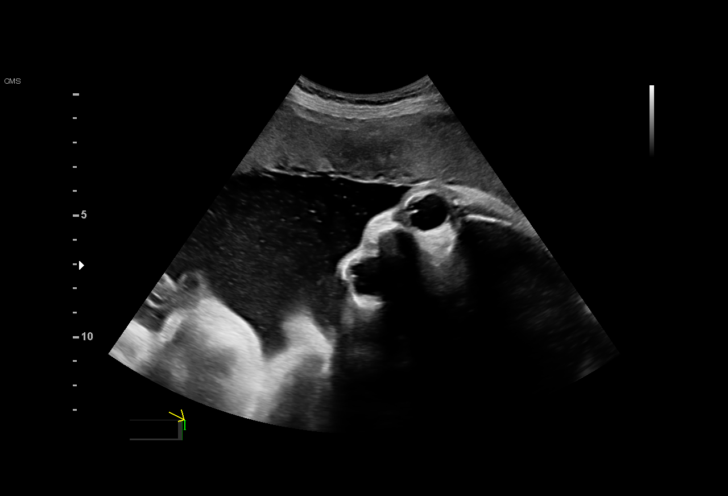
[im 7/60]
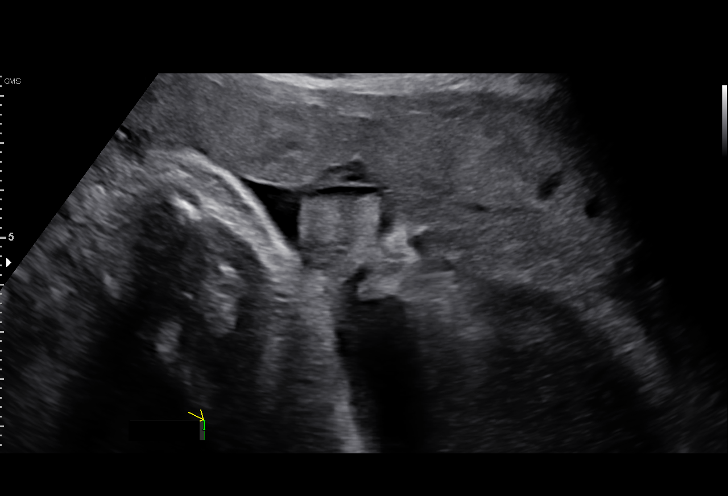
[im 11/60]
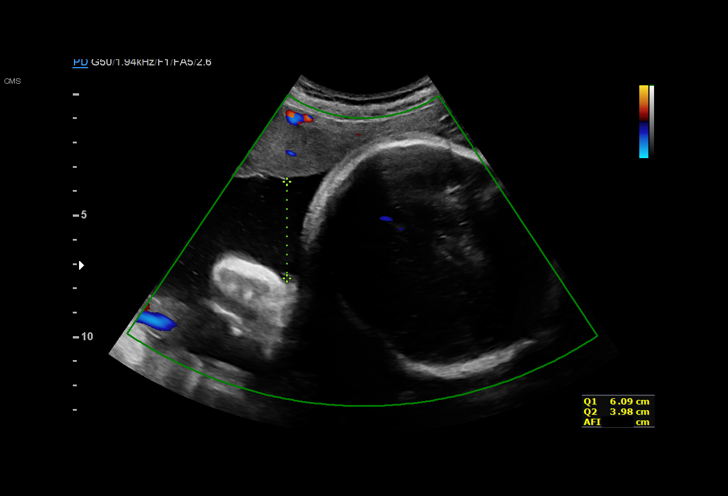
[im 16/60]
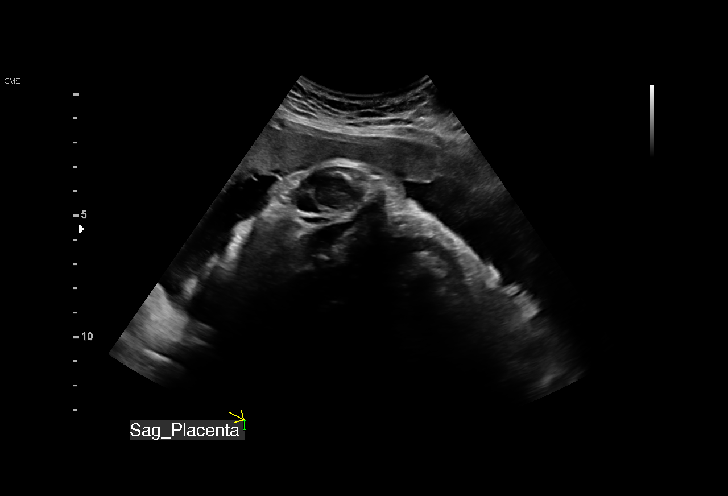
[im 20/60]
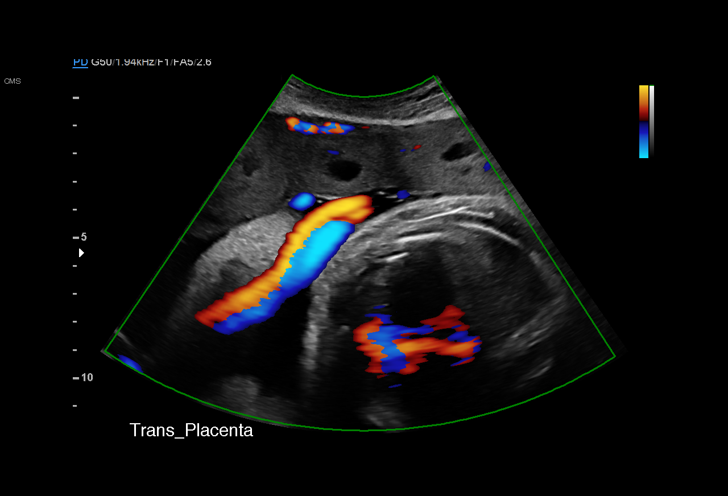
[im 25/60]
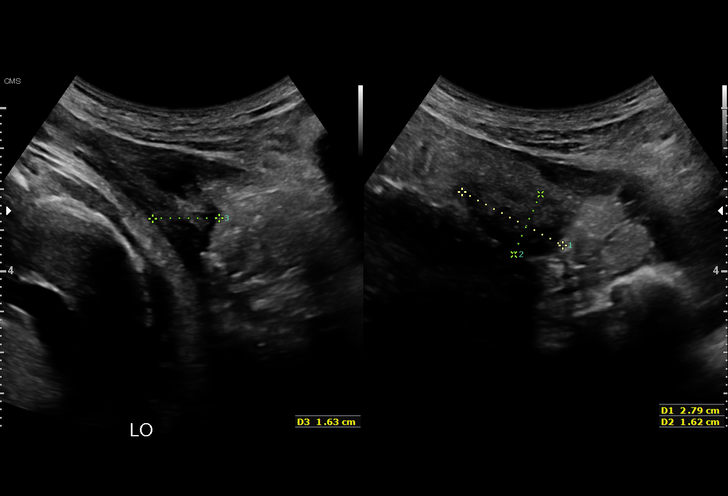
[im 31/60]
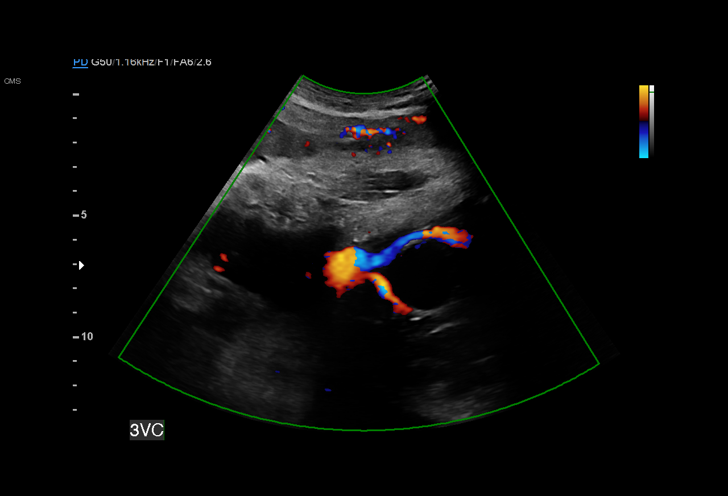
[im 35/60]
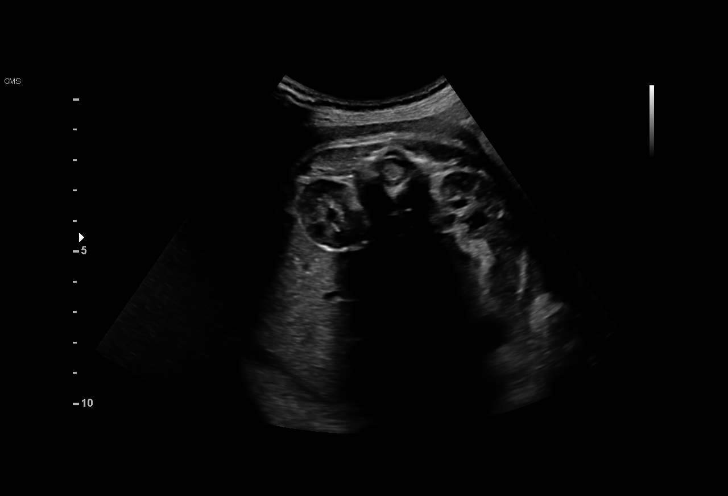
[im 40/60]
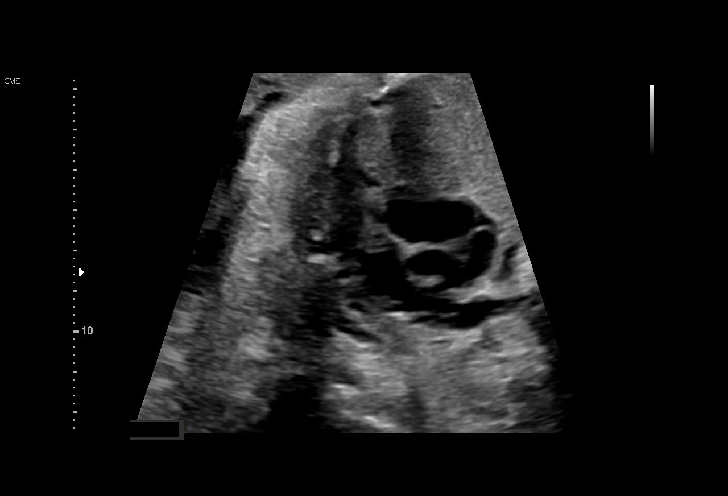
[im 44/60]
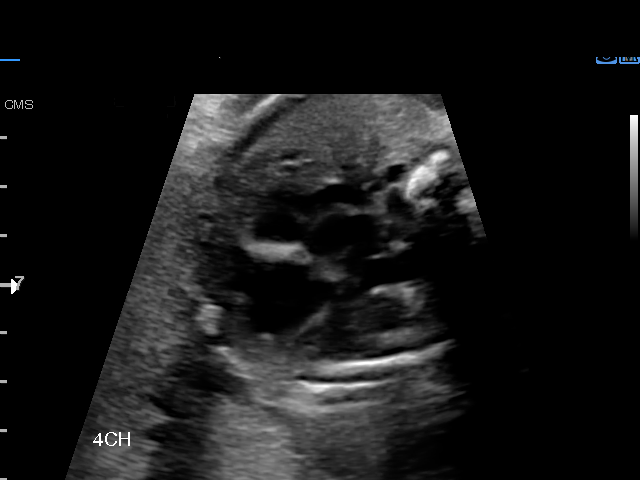
[im 49/60]
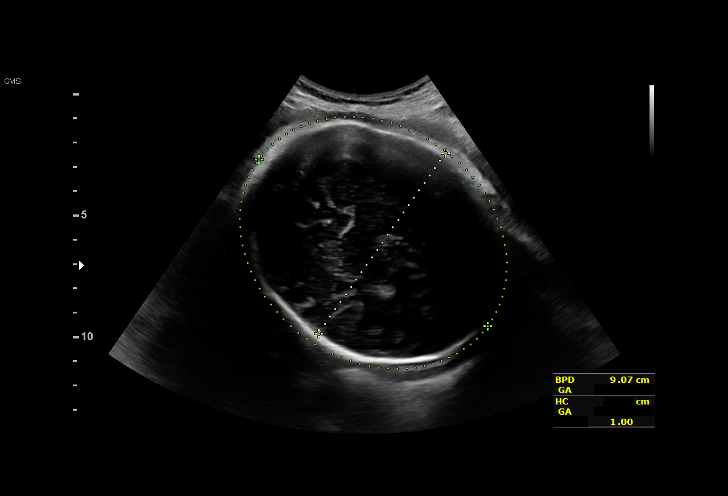
[im 53/60]
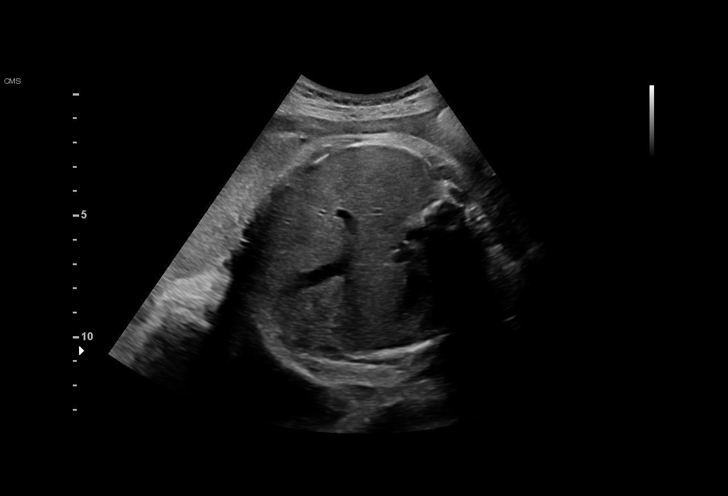
[im 57/60]
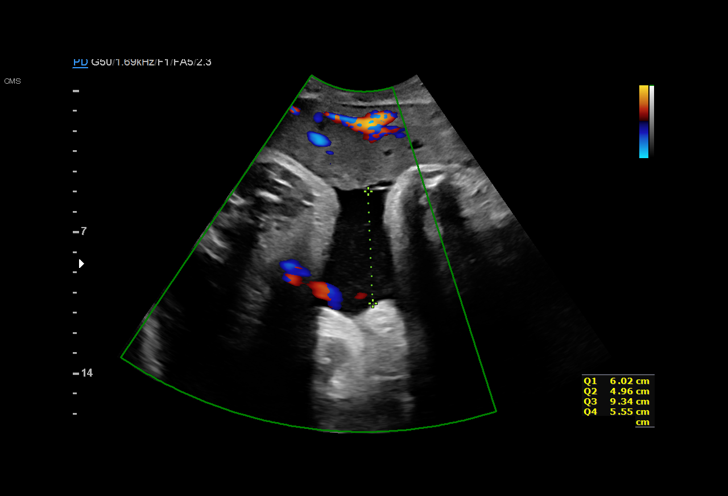

[13 of 28 positions shown; findings below may reference images not displayed]

FINDINGS: 1. Single intrauterine pregnancy.
2. Estimated fetal weight is in the 68th%.
3. Anterior placenta without evidence of previa.
4. Polyhydramnios with AFI of 26cm.
5. The limited anatomy survey is normal.
6. Normal biophysical profile of [DATE].
Recommendations

1. Appropriate fetal growth.
2. Gestational diabetes:
- on metformin but patient reports has not started it yet
- I encouraged her to begin this today
- likely the source of polyhydramnios
- continue antenatal testing
- recommend delivery around 39 weeks
3. Abnormal quad screen:
- previously counseled
- declined further testing
4. Polyhydramnios:
- likely due to diabetes
- recommend strict glucose control
- recommend fetal [place]umper
- recommend delivery around 39 weeks
5. Non reactive NST:
- BPP [DATE]; overall NST [DATE]

## 2019-10-21 ENCOUNTER — Other Ambulatory Visit: Payer: Self-pay

## 2019-10-21 ENCOUNTER — Encounter: Payer: Self-pay | Admitting: Family Medicine

## 2019-10-21 ENCOUNTER — Ambulatory Visit (INDEPENDENT_AMBULATORY_CARE_PROVIDER_SITE_OTHER): Payer: Self-pay | Admitting: Family Medicine

## 2019-10-21 VITALS — BP 120/71 | HR 74 | Wt 144.1 lb

## 2019-10-21 DIAGNOSIS — Z3046 Encounter for surveillance of implantable subdermal contraceptive: Secondary | ICD-10-CM

## 2019-10-21 NOTE — Progress Notes (Signed)
  Nexplanon Removal:  Patient given informed consent for removal of her Implanon, time out was performed.  Signed copy in the chart.  Appropriate time out taken. Implanon site identified.  Area prepped in usual sterile fashon. 61mL of 1% lidocaine was used to anesthetize the area at the distal end of the implant. A small stab incision was made right beside the implant on the distal portion.  The implanon rod was grasped using hemostats and removed without difficulty.  There was less than 3 cc blood loss. There were no complications.  A small amount of antibiotic ointment and steri-strips were applied over the small incision.  A pressure bandage was applied to reduce any bruising.  The patient tolerated the procedure well and was given post procedure instructions.  Information given to patient about [facility] for PAP.

## 2019-10-21 NOTE — Progress Notes (Signed)
[facility] language interpreter Vickie Prince [phone].

## 2019-10-27 ENCOUNTER — Telehealth: Payer: Self-pay

## 2019-10-27 NOTE — Telephone Encounter (Signed)
Telephoned patient at home number (interpreter [phone]). Left voice message with [facility] contact information.

## 2024-01-19 ENCOUNTER — Encounter: Payer: Self-pay | Admitting: Family Medicine

## 2024-02-16 ENCOUNTER — Ambulatory Visit: Payer: Self-pay | Admitting: Obstetrics & Gynecology

## 2024-02-16 VITALS — BP 105/70 | HR 77 | Wt 131.0 lb

## 2024-02-16 DIAGNOSIS — Z3046 Encounter for surveillance of implantable subdermal contraceptive: Secondary | ICD-10-CM | POA: Diagnosis not present

## 2024-02-16 DIAGNOSIS — Z30016 Encounter for initial prescription of transdermal patch hormonal contraceptive device: Secondary | ICD-10-CM

## 2024-02-16 MED ORDER — NORELGESTROMIN-ETH ESTRADIOL 150-35 MCG/24HR TD PTWK: 1.0000 | MEDICATED_PATCH | TRANSDERMAL | 12 refills | Status: AC

## 2024-02-16 NOTE — Progress Notes (Signed)
 [employer] language services interpreter [place] [phone].

## 2024-02-16 NOTE — Progress Notes (Signed)
 GYNECOLOGY OFFICE PROCEDURE NOTE  Vickie Prince is a 43 y.o. [phone] here for Nexplanon  removal. She requests contraceptive patch   Nexplanon  Removal Patient identified, informed consent performed, consent signed.   Appropriate time out taken. Nexplanon  site identified.  Area prepped in usual sterile fashon. One ml of 1% lidocaine  was used to anesthetize the area at the distal end of the implant. A small stab incision was made right beside the implant on the distal portion.  The Nexplanon  rod was grasped using hemostats and removed without difficulty.  There was minimal blood loss. There were no complications.  3 ml of 1% lidocaine  was injected around the incision for post-procedure analgesia.  Steri-strips were applied over the small incision.  A pressure bandage was applied to reduce any bruising.  The patient tolerated the procedure well and was given post procedure instructions.  Patient is planning to use Xulane for contraception.      [name], MD Attending Obstetrician & Gynecologist, [facility]  02/16/2024
